# Patient Record
Sex: Male | Born: 1980 | Race: White | Hispanic: No
Health system: Midwestern US, Community
[De-identification: ages and names within clinical notes are randomized; demographics above are authoritative.]

## PROBLEM LIST (undated history)

## (undated) DIAGNOSIS — M2550 Pain in unspecified joint: Secondary | ICD-10-CM

## (undated) DIAGNOSIS — K219 Gastro-esophageal reflux disease without esophagitis: Secondary | ICD-10-CM

## (undated) DIAGNOSIS — N200 Calculus of kidney: Secondary | ICD-10-CM

## (undated) DIAGNOSIS — J189 Pneumonia, unspecified organism: Secondary | ICD-10-CM

## (undated) DIAGNOSIS — T7840XA Allergy, unspecified, initial encounter: Secondary | ICD-10-CM

## (undated) DIAGNOSIS — F32A Depression, unspecified: Secondary | ICD-10-CM

## (undated) DIAGNOSIS — M069 Rheumatoid arthritis, unspecified: Secondary | ICD-10-CM

## (undated) DIAGNOSIS — K859 Acute pancreatitis without necrosis or infection, unspecified: Secondary | ICD-10-CM

## (undated) DIAGNOSIS — F419 Anxiety disorder, unspecified: Secondary | ICD-10-CM

## (undated) HISTORY — DX: Allergy, unspecified, initial encounter: T78.40XA

## (undated) HISTORY — PX: TONSILLECTOMY: SUR1361

## (undated) HISTORY — DX: Depression, unspecified: F32.A

## (undated) HISTORY — DX: Anxiety disorder, unspecified: F41.9

## (undated) HISTORY — PX: APPENDECTOMY: SHX54

## (undated) HISTORY — DX: Rheumatoid arthritis, unspecified: M06.9

## (undated) HISTORY — DX: Gastro-esophageal reflux disease without esophagitis: K21.9

## (undated) HISTORY — DX: Pain in unspecified joint: M25.50

---

## 2004-07-04 ENCOUNTER — Emergency Department: Payer: Self-pay | Admitting: Emergency Medicine

## 2005-02-28 ENCOUNTER — Emergency Department: Payer: Self-pay | Admitting: Emergency Medicine

## 2005-12-27 ENCOUNTER — Emergency Department: Payer: Self-pay | Admitting: General Practice

## 2006-05-22 ENCOUNTER — Emergency Department: Payer: Self-pay

## 2006-07-09 ENCOUNTER — Emergency Department: Payer: Self-pay | Admitting: Emergency Medicine

## 2006-10-11 ENCOUNTER — Emergency Department: Payer: Self-pay | Admitting: Emergency Medicine

## 2007-02-18 ENCOUNTER — Emergency Department: Payer: Self-pay | Admitting: Emergency Medicine

## 2007-09-20 ENCOUNTER — Emergency Department: Payer: Self-pay | Admitting: Emergency Medicine

## 2007-09-26 ENCOUNTER — Emergency Department: Payer: Self-pay | Admitting: Internal Medicine

## 2007-11-03 ENCOUNTER — Ambulatory Visit: Payer: Self-pay | Admitting: General Surgery

## 2007-11-12 ENCOUNTER — Emergency Department: Payer: Self-pay | Admitting: Emergency Medicine

## 2008-02-29 ENCOUNTER — Emergency Department: Payer: Self-pay | Admitting: Emergency Medicine

## 2008-03-15 ENCOUNTER — Emergency Department: Payer: Self-pay | Admitting: Emergency Medicine

## 2008-03-25 ENCOUNTER — Emergency Department: Payer: Self-pay | Admitting: Emergency Medicine

## 2008-03-26 ENCOUNTER — Emergency Department: Payer: Self-pay | Admitting: Emergency Medicine

## 2008-06-15 ENCOUNTER — Observation Stay: Payer: Self-pay | Admitting: General Surgery

## 2008-10-17 ENCOUNTER — Emergency Department: Payer: Self-pay | Admitting: Emergency Medicine

## 2008-10-20 ENCOUNTER — Emergency Department: Payer: Self-pay | Admitting: Unknown Physician Specialty

## 2009-04-28 ENCOUNTER — Emergency Department: Payer: Self-pay | Admitting: Internal Medicine

## 2009-05-03 ENCOUNTER — Emergency Department: Payer: Self-pay | Admitting: Unknown Physician Specialty

## 2011-08-03 ENCOUNTER — Emergency Department: Payer: Self-pay | Admitting: *Deleted

## 2011-08-03 LAB — BASIC METABOLIC PANEL
Calcium, Total: 9.9 mg/dL (ref 8.5–10.1)
Co2: 26 mmol/L (ref 21–32)
Creatinine: 1.04 mg/dL (ref 0.60–1.30)
EGFR (African American): >60
EGFR (Non-African Amer.): >60
Glucose: 102 mg/dL — ABNORMAL HIGH (ref 65–99)
Potassium: 4.3 mmol/L (ref 3.5–5.1)

## 2011-08-03 LAB — CBC
HCT: 50.1 % (ref 40.0–52.0)
HGB: 17.4 g/dL (ref 13.0–18.0)
MCH: 32 pg (ref 26.0–34.0)
MCHC: 34.7 g/dL (ref 32.0–36.0)
Platelet: 163 10*3/uL (ref 150–440)
RBC: 5.44 10*6/uL (ref 4.40–5.90)
RDW: 12.8 % (ref 11.5–14.5)
WBC: 7.8 10*3/uL (ref 3.8–10.6)

## 2011-08-03 LAB — TROPONIN I: Troponin-I: <0.02 ng/mL

## 2011-08-03 LAB — CK TOTAL AND CKMB (NOT AT ARMC): CK-MB: 1.2 ng/mL (ref 0.5–3.6)

## 2011-08-04 LAB — LIPASE, BLOOD: Lipase: 97 U/L (ref 73–393)

## 2012-07-29 ENCOUNTER — Emergency Department: Payer: Self-pay | Admitting: Emergency Medicine

## 2012-07-29 LAB — URINALYSIS, COMPLETE
Bilirubin,UR: NEGATIVE
Glucose,UR: NEGATIVE mg/dL (ref 0–75)
Leukocyte Esterase: NEGATIVE
Nitrite: NEGATIVE
Ph: 6 (ref 4.5–8.0)
Protein: 100
RBC,UR: 1184 /HPF (ref 0–5)
Specific Gravity: 1.027 (ref 1.003–1.030)
Squamous Epithelial: 4

## 2012-07-29 LAB — BASIC METABOLIC PANEL
Anion Gap: 5 — ABNORMAL LOW (ref 7–16)
Calcium, Total: 8.7 mg/dL (ref 8.5–10.1)
Creatinine: 0.97 mg/dL (ref 0.60–1.30)
EGFR (Non-African Amer.): >60
Osmolality: 280 (ref 275–301)
Sodium: 140 mmol/L (ref 136–145)

## 2012-07-29 LAB — CBC WITH DIFFERENTIAL/PLATELET
Basophil #: 0.1 10*3/uL (ref 0.0–0.1)
Eosinophil %: 10.5 %
HCT: 45.6 % (ref 40.0–52.0)
HGB: 16.1 g/dL (ref 13.0–18.0)
MCH: 31.7 pg (ref 26.0–34.0)
MCHC: 35.4 g/dL (ref 32.0–36.0)
Monocyte #: 0.6 x10 3/mm (ref 0.2–1.0)
Neutrophil %: 64.9 %
RDW: 12.6 % (ref 11.5–14.5)

## 2012-09-07 ENCOUNTER — Emergency Department: Payer: Self-pay | Admitting: Emergency Medicine

## 2012-09-08 LAB — CBC
HCT: 47.8 % (ref 40.0–52.0)
HGB: 16.5 g/dL (ref 13.0–18.0)
MCHC: 34.5 g/dL (ref 32.0–36.0)
Platelet: 169 10*3/uL (ref 150–440)
RBC: 5.27 10*6/uL (ref 4.40–5.90)

## 2012-09-08 LAB — URINALYSIS, COMPLETE
Bilirubin,UR: NEGATIVE
Glucose,UR: 50 mg/dL (ref 0–75)
Nitrite: NEGATIVE
Ph: 5 (ref 4.5–8.0)
RBC,UR: 2302 /HPF (ref 0–5)
Specific Gravity: 1.021 (ref 1.003–1.030)
Squamous Epithelial: 1
WBC UR: 39 /HPF (ref 0–5)

## 2012-09-08 LAB — BASIC METABOLIC PANEL
Anion Gap: 6 — ABNORMAL LOW (ref 7–16)
Chloride: 111 mmol/L — ABNORMAL HIGH (ref 98–107)
EGFR (African American): >60
Glucose: 114 mg/dL — ABNORMAL HIGH (ref 65–99)
Osmolality: 276 (ref 275–301)
Sodium: 138 mmol/L (ref 136–145)

## 2013-02-02 ENCOUNTER — Emergency Department: Payer: Self-pay | Admitting: Emergency Medicine

## 2013-02-02 LAB — COMPREHENSIVE METABOLIC PANEL
Anion Gap: 10 (ref 7–16)
BUN: 16 mg/dL (ref 7–18)
Bilirubin,Total: 0.5 mg/dL (ref 0.2–1.0)
Co2: 18 mmol/L — ABNORMAL LOW (ref 21–32)
Creatinine: 1.19 mg/dL (ref 0.60–1.30)
EGFR (African American): >60
EGFR (Non-African Amer.): >60
Glucose: 128 mg/dL — ABNORMAL HIGH (ref 65–99)
Osmolality: 280 (ref 275–301)
Potassium: 3.8 mmol/L (ref 3.5–5.1)
SGPT (ALT): 47 U/L (ref 12–78)
Sodium: 139 mmol/L (ref 136–145)
Total Protein: 7.6 g/dL (ref 6.4–8.2)

## 2013-02-02 LAB — URINALYSIS, COMPLETE
Bilirubin,UR: NEGATIVE
Blood: NEGATIVE
Glucose,UR: NEGATIVE mg/dL (ref 0–75)
Protein: 30
Specific Gravity: 1.035 (ref 1.003–1.030)
Squamous Epithelial: 2
WBC UR: 8 /HPF (ref 0–5)

## 2013-02-02 LAB — CBC WITH DIFFERENTIAL/PLATELET
Basophil #: 0 10*3/uL (ref 0.0–0.1)
Eosinophil #: 0.8 10*3/uL — ABNORMAL HIGH (ref 0.0–0.7)
Eosinophil %: 7.9 %
HCT: 48.1 % (ref 40.0–52.0)
Lymphocyte %: 20 %
MCH: 31.6 pg (ref 26.0–34.0)
Monocyte %: 7.6 %
Neutrophil #: 6.6 10*3/uL — ABNORMAL HIGH (ref 1.4–6.5)
Neutrophil %: 64 %
Platelet: 200 10*3/uL (ref 150–440)
RDW: 13.1 % (ref 11.5–14.5)
WBC: 10.2 10*3/uL (ref 3.8–10.6)

## 2013-02-02 LAB — LIPASE, BLOOD: Lipase: 112 U/L (ref 73–393)

## 2013-09-16 ENCOUNTER — Emergency Department: Payer: Self-pay | Admitting: Emergency Medicine

## 2013-09-16 LAB — URINALYSIS, COMPLETE
BACTERIA: NONE SEEN
Bilirubin,UR: NEGATIVE
Glucose,UR: NEGATIVE mg/dL (ref 0–75)
Ketone: NEGATIVE
LEUKOCYTE ESTERASE: NEGATIVE
Nitrite: NEGATIVE
PH: 5 (ref 4.5–8.0)
Protein: NEGATIVE
SQUAMOUS EPITHELIAL: NONE SEEN
Specific Gravity: 1.021 (ref 1.003–1.030)

## 2013-09-16 LAB — COMPREHENSIVE METABOLIC PANEL
ANION GAP: 8 (ref 7–16)
Albumin: 3.8 g/dL (ref 3.4–5.0)
Alkaline Phosphatase: 85 U/L
BUN: 14 mg/dL (ref 7–18)
Bilirubin,Total: 0.5 mg/dL (ref 0.2–1.0)
CALCIUM: 8.5 mg/dL (ref 8.5–10.1)
CHLORIDE: 107 mmol/L (ref 98–107)
CO2: 22 mmol/L (ref 21–32)
Creatinine: 1.13 mg/dL (ref 0.60–1.30)
EGFR (Non-African Amer.): >60
Glucose: 105 mg/dL — ABNORMAL HIGH (ref 65–99)
Osmolality: 275 (ref 275–301)
Potassium: 3.9 mmol/L (ref 3.5–5.1)
SGOT(AST): 34 U/L (ref 15–37)
SGPT (ALT): 61 U/L
SODIUM: 137 mmol/L (ref 136–145)
TOTAL PROTEIN: 8 g/dL (ref 6.4–8.2)

## 2013-09-16 LAB — ACETAMINOPHEN LEVEL: ACETAMINOPHEN: 4 ug/mL — AB

## 2013-09-16 LAB — CBC
HCT: 50.2 % (ref 40.0–52.0)
HGB: 17.2 g/dL (ref 13.0–18.0)
MCH: 31.8 pg (ref 26.0–34.0)
MCHC: 34.2 g/dL (ref 32.0–36.0)
MCV: 93 fL (ref 80–100)
Platelet: 157 10*3/uL (ref 150–440)
RBC: 5.41 10*6/uL (ref 4.40–5.90)
RDW: 12.6 % (ref 11.5–14.5)
WBC: 8.8 10*3/uL (ref 3.8–10.6)

## 2013-09-16 LAB — DRUG SCREEN, URINE
Amphetamines, Ur Screen: NEGATIVE (ref ?–1000)
Barbiturates, Ur Screen: NEGATIVE (ref ?–200)
Benzodiazepine, Ur Scrn: NEGATIVE (ref ?–200)
Cannabinoid 50 Ng, Ur ~~LOC~~: POSITIVE (ref ?–50)
Cocaine Metabolite,Ur ~~LOC~~: NEGATIVE (ref ?–300)
MDMA (Ecstasy)Ur Screen: NEGATIVE (ref ?–500)
Methadone, Ur Screen: NEGATIVE (ref ?–300)
Opiate, Ur Screen: NEGATIVE (ref ?–300)
Phencyclidine (PCP) Ur S: NEGATIVE (ref ?–25)
TRICYCLIC, UR SCREEN: NEGATIVE (ref ?–1000)

## 2013-09-16 LAB — ETHANOL: Ethanol %: <0.003 % (ref 0.000–0.080)

## 2013-09-16 LAB — SALICYLATE LEVEL: SALICYLATES, SERUM: 3.1 mg/dL — AB

## 2014-02-22 DIAGNOSIS — K859 Acute pancreatitis without necrosis or infection, unspecified: Secondary | ICD-10-CM

## 2014-02-22 HISTORY — DX: Acute pancreatitis without necrosis or infection, unspecified: K85.90

## 2014-05-01 DIAGNOSIS — Z6841 Body Mass Index (BMI) 40.0 and over, adult: Secondary | ICD-10-CM | POA: Insufficient documentation

## 2014-05-03 DIAGNOSIS — R161 Splenomegaly, not elsewhere classified: Secondary | ICD-10-CM | POA: Insufficient documentation

## 2014-05-03 DIAGNOSIS — K760 Fatty (change of) liver, not elsewhere classified: Secondary | ICD-10-CM | POA: Insufficient documentation

## 2014-05-03 DIAGNOSIS — Z791 Long term (current) use of non-steroidal anti-inflammatories (NSAID): Secondary | ICD-10-CM | POA: Insufficient documentation

## 2014-06-15 NOTE — Consult Note (Signed)
PATIENT NAME:  Bradley Barr, Jaclyn F MR#:  [id number] DATE OF BIRTH:  February 25, 1980  DATE OF CONSULTATION:  09/16/2013  REFERRING PHYSICIAN:   CONSULTING PHYSICIAN:  Henessy Rohrer K. Guss Bundehalla, MD  PLACE OF DICTATION: [facility]   SEX: Male.  RACE: White.  SUBJECTIVE: The patient was seen in consultation in [facility]. The patient is a 34 year old white male, not employed and last worked in the early part of 2015 and he lost his job. Married for 7 years and lives with his wife. 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 The patient was upset because he was stressed out with lots of stressors which include 2 of his cars are not working at the same time and in addition, not having a job and having financial stressors, and he went out and he could not get a ride back and when he called his wife, she did not reply and when he found a ride and came home, she was not very friendly and this has upset him. He expected her to hug him and tell him that he would be okay, as his job is going to begin tomorrow, 09/17/2013. Instead, she was rude to him. This made him upset and so he talked about hurting himself because he was frustrated and upset.   PAST PSYCHIATRIC HISTORY: History of inpatient holds on psychiatry more than 10 years ago at [facility] for 4 days when he overdosed on Klonopin. No history of suicide attempts.   ALCOHOL AND DRUGS: Admits that he smokes THC occasionally but not on a regular basis. Smokes nicotine cigarettes at the rate of one-half pack a day for many years.   MENTAL STATUS EXAMINATION: The patient is alert and oriented to place, person and time. Fully aware of situation that [facility]. Affect is bright and cheerful. Denies feeling depressed. Denies feeling hopeless or helpless. Does admit being frustrated about both  of his cars not working at the same time and being unemployed for quite some time and having financial stressors related to the same, which have led to marital conflicts. No psychosis. Denies auditory or visual hallucinations. Memory is intact. Cognition intact. General fund of information fair. Contracts for safety. Insight and judgment fair.  IMPRESSION:  1. Impulse control disorder. 2. Adjustment disorder with frustration and depression secondary to losing job and being unemployed since January 2015. 3. Recommend D/C IVC and discharge patient home to go back to his family and he is eager to join his job on 09/17/2013 and does admit that he has done something "stupid" and he did not mean it. The patient will get counseling on an outpatient basis and he will get marital counseling so that he and his wife will have a better understanding of each other.       ____________________________ Jannet MantisSurya K. Guss Bundehalla, MD skc:lm D: 09/16/2013 17:52:46 ET T: 09/16/2013 20:36:18 ET JOB#: [id number]  cc: Monika SalkSurya K. Guss Bundehalla, MD, <Dictator> Beau FannySURYA K Jed Kutch MD ELECTRONICALLY SIGNED 09/17/2013 7:55

## 2014-12-12 ENCOUNTER — Encounter: Payer: Self-pay | Admitting: Emergency Medicine

## 2014-12-12 ENCOUNTER — Emergency Department: Payer: Self-pay

## 2014-12-12 ENCOUNTER — Emergency Department
Admission: EM | Admit: 2014-12-12 | Discharge: 2014-12-12 | Disposition: A | Discharge status: Home / Self Care | Payer: Self-pay | Attending: Emergency Medicine | Admitting: Emergency Medicine

## 2014-12-12 DIAGNOSIS — Y92410 Unspecified street and highway as the place of occurrence of the external cause: Secondary | ICD-10-CM | POA: Insufficient documentation

## 2014-12-12 DIAGNOSIS — S8001XA Contusion of right knee, initial encounter: Secondary | ICD-10-CM | POA: Insufficient documentation

## 2014-12-12 DIAGNOSIS — Y9389 Activity, other specified: Secondary | ICD-10-CM | POA: Insufficient documentation

## 2014-12-12 DIAGNOSIS — Z720 Tobacco use: Secondary | ICD-10-CM | POA: Insufficient documentation

## 2014-12-12 DIAGNOSIS — Y998 Other external cause status: Secondary | ICD-10-CM | POA: Insufficient documentation

## 2014-12-12 DIAGNOSIS — S20211A Contusion of right front wall of thorax, initial encounter: Secondary | ICD-10-CM | POA: Insufficient documentation

## 2014-12-12 HISTORY — DX: Calculus of kidney: N20.0

## 2014-12-12 HISTORY — DX: Acute pancreatitis without necrosis or infection, unspecified: K85.90

## 2014-12-12 MED ORDER — HYDROCODONE-ACETAMINOPHEN 5-325 MG PO TABS: 1.0000 | ORAL_TABLET | ORAL | Status: DC | PRN

## 2014-12-12 MED ORDER — IBUPROFEN 800 MG PO TABS: 800.0000 mg | ORAL_TABLET | Freq: Three times a day (TID) | ORAL | Status: DC

## 2014-12-12 NOTE — Discharge Instructions (Signed)
Chest Contusion A contusion is a deep bruise. Bruises happen when an injury causes bleeding under the skin. Signs of bruising include pain, puffiness (swelling), and discolored skin. The bruise may turn blue, purple, or yellow.  HOME CARE  Put ice on the injured area.  Put ice in a plastic bag.  Place a towel between the skin and the bag.  Leave the ice on for 15-20 minutes at a time, 03-04 times a day for the first 48 hours.  Only take medicine as told by your doctor.  Rest.  Take deep breaths (deep-breathing exercises) as told by your doctor.  Stop smoking if you smoke.  Do not lift objects over 5 pounds (2.3 kilograms) for 3 days or longer if told by your doctor. GET HELP RIGHT AWAY IF:   You have more bruising or puffiness.  You have pain that gets worse.  You have trouble breathing.  You are dizzy, weak, or pass out (faint).  You have blood in your pee (urine) or poop (stool).  You cough up or throw up (vomit) blood.  Your puffiness or pain is not helped with medicines. MAKE SURE YOU:   Understand these instructions.  Will watch your condition.  Will get help right away if you are not doing well or get worse.   This information is not intended to replace advice given to you by your health care provider. Make sure you discuss any questions you have with your health care provider.   Document Released: 07/28/2007 Document Revised: 11/03/2011 Document Reviewed: 08/02/2011 Elsevier Interactive Patient Education [employer] Elsevier Inc.    Follow-up with your doctor or [facility] if any continued problems with your chest and ribs. Follow-up with Dr. Joice LoftsPoggi if any continued problems with her knee. Ibuprofen as needed for inflammation and soreness. Norco as needed for severe pain. Ice and elevate knee as needed.
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
## 2014-12-18 ENCOUNTER — Emergency Department
Admission: EM | Admit: 2014-12-18 | Discharge: 2014-12-18 | Disposition: A | Discharge status: Home / Self Care | Payer: No Typology Code available for payment source | Attending: Emergency Medicine | Admitting: Emergency Medicine

## 2014-12-18 DIAGNOSIS — F32A Depression, unspecified: Secondary | ICD-10-CM

## 2014-12-18 DIAGNOSIS — Z720 Tobacco use: Secondary | ICD-10-CM | POA: Insufficient documentation

## 2014-12-18 DIAGNOSIS — F410 Panic disorder [episodic paroxysmal anxiety] without agoraphobia: Secondary | ICD-10-CM

## 2014-12-18 DIAGNOSIS — Z79899 Other long term (current) drug therapy: Secondary | ICD-10-CM | POA: Insufficient documentation

## 2014-12-18 DIAGNOSIS — F331 Major depressive disorder, recurrent, moderate: Secondary | ICD-10-CM

## 2014-12-18 DIAGNOSIS — F329 Major depressive disorder, single episode, unspecified: Secondary | ICD-10-CM | POA: Insufficient documentation

## 2014-12-18 LAB — COMPREHENSIVE METABOLIC PANEL
ALBUMIN: 4.8 g/dL (ref 3.5–5.0)
ALT: 40 U/L (ref 17–63)
ANION GAP: 7 (ref 5–15)
AST: 25 U/L (ref 15–41)
Alkaline Phosphatase: 72 U/L (ref 38–126)
BUN: 10 mg/dL (ref 6–20)
CHLORIDE: 103 mmol/L (ref 101–111)
CO2: 28 mmol/L (ref 22–32)
Calcium: 9.4 mg/dL (ref 8.9–10.3)
Creatinine, Ser: 1.26 mg/dL — ABNORMAL HIGH (ref 0.61–1.24)
GFR calc non Af Amer: >60 mL/min (ref >60)
GLUCOSE: 109 mg/dL — AB (ref 65–99)
Potassium: 4.1 mmol/L (ref 3.5–5.1)
SODIUM: 138 mmol/L (ref 135–145)
Total Bilirubin: 1.1 mg/dL (ref 0.3–1.2)
Total Protein: 8.2 g/dL — ABNORMAL HIGH (ref 6.5–8.1)

## 2014-12-18 LAB — CBC
HCT: 52.8 % — ABNORMAL HIGH (ref 40.0–52.0)
HEMOGLOBIN: 17.9 g/dL (ref 13.0–18.0)
MCH: 32.2 pg (ref 26.0–34.0)
MCHC: 33.9 g/dL (ref 32.0–36.0)
MCV: 94.9 fL (ref 80.0–100.0)
Platelets: 192 10*3/uL (ref 150–440)
RBC: 5.56 MIL/uL (ref 4.40–5.90)
RDW: 13 % (ref 11.5–14.5)
WBC: 9.1 10*3/uL (ref 3.8–10.6)

## 2014-12-18 LAB — SALICYLATE LEVEL

## 2014-12-18 LAB — ETHANOL: Alcohol, Ethyl (B): <5 mg/dL (ref <5)

## 2014-12-18 LAB — ACETAMINOPHEN LEVEL

## 2014-12-18 MED ORDER — CITALOPRAM HYDROBROMIDE 20 MG PO TABS: 20.0000 mg | ORAL_TABLET | Freq: Every day | ORAL | Status: DC

## 2014-12-18 NOTE — ED Notes (Signed)
Mr. Bradley Barr was dressed out into the proper hospital purple scrubs and taken to rm. 20 for further care. Amy Bruna Pottereague was the nurse.

## 2014-12-18 NOTE — ED Notes (Signed)
1/1 bags of belongings returned to him and he verbal;ized that he received back all belongings that he came here with   Discharge and follow up instructions reviewed with him and he verbalized agreement and understanding   

## 2014-12-18 NOTE — ED Notes (Signed)
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
## 2014-12-18 NOTE — ED Notes (Signed)
Lunch provided along with an extra drink   Appropriate to stimulation  No verbalized needs or concerns at this time  NAD assessed  Continue to monitor 

## 2014-12-18 NOTE — [facility] Counselor (Signed)
Per request of Psych  MD (Dr. Clapacs), writer provided the pt. with information and instructions on how to access Outpatient Mental Health & Substance Abuse Treatment ([facility]). 

## 2014-12-18 NOTE — ED Notes (Signed)
BEHAVIORAL HEALTH ROUNDING Patient sleeping: No. Patient alert and oriented: yes Behavior appropriate: Yes.  ; If no, describe:  Nutrition and fluids offered: yes Toileting and hygiene offered: Yes  Sitter present: q15 minute observations and security camera monitoring Law enforcement present: Yes  ODS  

## 2014-12-18 NOTE — ED Notes (Signed)
Patient comes in tearful and verbalized that "I got a lot of shit goin on".  Patient stated "I can't stop crying".
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
## 2015-02-05 ENCOUNTER — Emergency Department (HOSPITAL_COMMUNITY): Payer: Worker's Compensation

## 2015-02-05 ENCOUNTER — Encounter (HOSPITAL_COMMUNITY): Payer: Self-pay | Admitting: Emergency Medicine

## 2015-02-05 ENCOUNTER — Emergency Department (HOSPITAL_COMMUNITY)
Admission: EM | Admit: 2015-02-05 | Discharge: 2015-02-05 | Disposition: A | Discharge status: Home / Self Care | Payer: Worker's Compensation | Attending: Emergency Medicine | Admitting: Emergency Medicine

## 2015-02-05 DIAGNOSIS — Z8719 Personal history of other diseases of the digestive system: Secondary | ICD-10-CM | POA: Diagnosis not present

## 2015-02-05 DIAGNOSIS — Z79899 Other long term (current) drug therapy: Secondary | ICD-10-CM | POA: Diagnosis not present

## 2015-02-05 DIAGNOSIS — Y9289 Other specified places as the place of occurrence of the external cause: Secondary | ICD-10-CM | POA: Diagnosis not present

## 2015-02-05 DIAGNOSIS — Y9389 Activity, other specified: Secondary | ICD-10-CM | POA: Diagnosis not present

## 2015-02-05 DIAGNOSIS — W010XXA Fall on same level from slipping, tripping and stumbling without subsequent striking against object, initial encounter: Secondary | ICD-10-CM | POA: Insufficient documentation

## 2015-02-05 DIAGNOSIS — M25561 Pain in right knee: Secondary | ICD-10-CM

## 2015-02-05 DIAGNOSIS — S8991XA Unspecified injury of right lower leg, initial encounter: Secondary | ICD-10-CM | POA: Diagnosis not present

## 2015-02-05 DIAGNOSIS — Z87442 Personal history of urinary calculi: Secondary | ICD-10-CM | POA: Diagnosis not present

## 2015-02-05 DIAGNOSIS — Y998 Other external cause status: Secondary | ICD-10-CM | POA: Diagnosis not present

## 2015-02-05 DIAGNOSIS — F17210 Nicotine dependence, cigarettes, uncomplicated: Secondary | ICD-10-CM | POA: Diagnosis not present

## 2015-02-05 MED ORDER — NAPROXEN 250 MG PO TABS
500.0000 mg | ORAL_TABLET | Freq: Once | ORAL | Status: AC
  Administered 2015-02-05: 500 mg via ORAL
  Filled 2015-02-05: qty 2

## 2015-02-05 NOTE — ED Notes (Signed)
Pt verbally abusive at time of discharge, "that's a shitty ass doctor, I'm supposed to go home with just tylenol? I can't walk. That's why I hate this hospital. I always have a problem here. This is a shitty hospital." Upset about not getting narcotics. Escorted to lobby with crutches via wheelchair. No belongings left in room.

## 2015-02-05 NOTE — ED Provider Notes (Signed)
CSN: [id number]     Arrival date & time 02/05/15  1909 History   First MD Initiated Contact with Patient 02/05/15 1914     Chief Complaint  Patient presents with  . Knee Pain    Patient is a 34 y.o. male presenting with knee pain. The history is provided by the patient.  Knee Pain Location:  Knee Time since incident:  30 minutes Injury: yes   Mechanism of injury: fall   Fall:    Fall occurred:  Tripped   Height of fall:  Standing Knee location:  R knee Pain details:    Quality:  Pressure and throbbing   Onset quality:  Sudden   Timing:  Constant Chronicity:  New Associated symptoms: no back pain, no fever and no neck pain     Past Medical History  Diagnosis Date  . Pancreatitis   . Kidney stones    History reviewed. No pertinent past surgical history. No family history on file. Social History  Substance Use Topics  . Smoking status: Current Every Day Smoker -- 0.50 packs/day    Types: Cigarettes  . Smokeless tobacco: None  . Alcohol Use: Yes     Comment: occasional    Review of Systems  Constitutional: Negative for fever and chills.  HENT: Negative for rhinorrhea and sore throat.   Eyes: Negative for visual disturbance.  Respiratory: Negative for cough and shortness of breath.   Cardiovascular: Negative for chest pain.  Gastrointestinal: Negative for nausea, vomiting, abdominal pain, diarrhea and constipation.  Genitourinary: Negative for dysuria and hematuria.  Musculoskeletal: Negative for back pain and neck pain.       Knee pain and swelling  Skin: Negative for rash.  Neurological: Negative for syncope and headaches.  Psychiatric/Behavioral: Negative for confusion.  All other systems reviewed and are negative.  Allergies  Review of patient's allergies indicates no known allergies.  Home Medications   Prior to Admission medications   Medication Sig Start Date End Date Taking? Authorizing Provider  citalopram (CELEXA) 20 MG tablet Take 1 tablet (20 mg  total) by mouth daily. 12/18/14  Yes Audery AmelJohn T Clapacs, MD   BP 116/72 mmHg  Pulse 98  Temp(Src) 98.2 F (36.8 C) (Oral)  Resp 16  Ht 5\' 9"  (1.753 m)  Wt 145.151 kg  BMI 47.23 kg/m2  SpO2 100% Physical Exam  Constitutional: He is oriented to person, place, and time. He appears well-developed and well-nourished. No distress.  HENT:  Head: Normocephalic and atraumatic.  Mouth/Throat: Oropharynx is clear and moist.  Eyes: EOM are normal.  Neck: Neck supple. No JVD present.  Cardiovascular: Normal rate, regular rhythm, normal heart sounds and intact distal pulses.   Pulmonary/Chest: Effort normal and breath sounds normal.  Abdominal: Soft. He exhibits no distension. There is no tenderness.  Musculoskeletal: He exhibits no edema.       Right knee: He exhibits decreased range of motion and swelling. He exhibits no deformity.  Right knee extensor mechanism intact. 2+ DP and PT pulses. Ankle range of motion intact. No obvious bony deformity. No patellar dislocation.  Neurological: He is alert and oriented to person, place, and time. No cranial nerve deficit.  Skin: Skin is warm and dry.  Psychiatric: His behavior is normal.    ED Course  Procedures  NONE   Labs Review Labs Reviewed - No data to display  Imaging Review Dg Knee 2 Views Right  02/05/2015  CLINICAL DATA:  Larey SeatFell at work tonight.  Right knee pain. EXAM:  RIGHT KNEE - 1-2 VIEW COMPARISON:  02/28/2005 FINDINGS: Moderate tricompartmental degenerative changes for age, progressive since prior examination. There is mild joint space narrowing and osteophytic spurring. Could not exclude a small loose ossified body in the joint. No acute fracture or obvious joint effusion. IMPRESSION: Age advanced tricompartmental degenerative changes but no acute fracture. Electronically Signed   By: Rudie Meyer M.D.   On: 02/05/2015 19:50   I have personally reviewed and evaluated these images and lab results as part of my medical  decision-making.  MDM   Final diagnoses:  Right knee pain    Patient is a 34 year old male who presents with acute onset right knee pain after a fall. Slipped on wet floor. Things he hyperextended his knee. Pain with ROM but no instability. Extensor mechanism intact. He did not land directly on his patella. Doubt patellar fracture. Distal pulses and neurologic function intact. Suspect ligamentous or meniscal injury. Will obtain x-ray and provide pain control. Discussed outpatient MRI and ortho followup. Anticipate discharge.   Imaging negative. I reiterated his discharge instructions with him. She requested "stronger pain medication." I discussed RICE therapy with him as well as management with anti-inflammatory medication. Patient voiced understanding and is agreeable with the follow-up plan. Knee immobilizer and crutches provided. Patient stable for discharge.  Discussed with Dr. Clayborne Dana.  Maris Berger, MD 02/06/15 0001  Marily Memos, MD 02/06/15 [phone]

## 2015-02-05 NOTE — ED Notes (Signed)
Patient transported to X-ray 

## 2015-02-05 NOTE — ED Notes (Signed)
Pt arrives via EMS from work, slipped on motor oil, fell on R hip and R knee and feels pain in those areas. Received 250 mcg fentanyl and 4mg  zofran with EMS. Unable to bear weight on R leg. VSS.

## 2015-02-05 NOTE — ED Provider Notes (Signed)
I saw and evaluated the patient, reviewed the resident's note and I agree with the findings and plan.   34 year old male states that he started to slip on oil felt a pop in his right knee and fell to the ground landing on his knee. Pain and swelling since that time. Exam with tenderness in that area. Suspect possible ligamentous injury. X-rays negative. We'll discuss follow-up with orthopedics.  Marily MemosJason Shuntia Exton, MD 02/05/15 [phone]

## 2015-02-07 ENCOUNTER — Telehealth: Payer: Self-pay | Admitting: *Deleted

## 2015-02-07 NOTE — Telephone Encounter (Signed)
Pt and employer called verifying Rx and weight restrictions.  NCM reviewed chart to fing no Rx given along with no weight restrictions.  Information relayed to pt and employer.

## 2015-06-10 DIAGNOSIS — F17210 Nicotine dependence, cigarettes, uncomplicated: Secondary | ICD-10-CM | POA: Insufficient documentation

## 2015-06-10 DIAGNOSIS — J029 Acute pharyngitis, unspecified: Secondary | ICD-10-CM | POA: Insufficient documentation

## 2015-06-10 NOTE — ED Notes (Signed)
Pt in with co problems swallowing was seen at [facility] for the same and given prednisone. States it is painful to swallow but has been drinking fluid.  Some redness and irritation noted to throat with no swelling.

## 2015-06-11 ENCOUNTER — Emergency Department
Admission: EM | Admit: 2015-06-11 | Discharge: 2015-06-11 | Disposition: A | Discharge status: Home / Self Care | Payer: Self-pay | Attending: Emergency Medicine | Admitting: Emergency Medicine

## 2015-06-11 ENCOUNTER — Emergency Department: Payer: Self-pay

## 2015-06-11 DIAGNOSIS — J029 Acute pharyngitis, unspecified: Secondary | ICD-10-CM

## 2015-06-11 LAB — POCT RAPID STREP A: STREPTOCOCCUS, GROUP A SCREEN (DIRECT): NEGATIVE

## 2015-06-11 MED ORDER — NYSTATIN 100000 UNIT/ML MT SUSP
5.0000 mL | Freq: Four times a day (QID) | OROMUCOSAL | Status: DC
  Administered 2015-06-11: 500000 [IU] via ORAL
  Filled 2015-06-11 (×4): qty 5

## 2015-06-11 MED ORDER — NYSTATIN 100000 UNIT/ML MT SUSP: 5.0000 mL | Freq: Four times a day (QID) | OROMUCOSAL | Status: AC

## 2015-06-11 MED ORDER — PENICILLIN G BENZATHINE 1200000 UNIT/2ML IM SUSP
1.2000 10*6.[IU] | Freq: Once | INTRAMUSCULAR | Status: AC
  Administered 2015-06-11: 1.2 10*6.[IU] via INTRAMUSCULAR
  Filled 2015-06-11: qty 2

## 2015-06-11 NOTE — ED Provider Notes (Signed)
[facility] Emergency Department Provider Note  ____________________________________________  Time seen: 2:00 AM  I have reviewed the triage vital signs and the nursing notes.   HISTORY  Chief Complaint Dysphagia    HPI Bradley Barr is a 35 y.o. male presents with sore throat and difficulty swallowing and redness noted in his throat times approximately one week. Patient states that he was [facility] and diagnosed with pharyngitis and prescribed prednisone however symptoms have persists. Patient denies any fever afebrile on presentation with a temperature 97.8. Patient does admit to noticing white plaque on his tongue which she was able to scrape off".    Past Medical History  Diagnosis Date  . Pancreatitis   . Kidney stones     Patient Active Problem List   Diagnosis Date Noted  . Depression, major, recurrent, moderate (HCC) 12/18/2014  . Panic attack 12/18/2014    No past surgical history on file.  Current Outpatient Rx  Name  Route  Sig  Dispense  Refill  . citalopram (CELEXA) 20 MG tablet   Oral   Take 1 tablet (20 mg total) by mouth daily.   30 tablet   2   . nystatin (MYCOSTATIN) 100000 UNIT/ML suspension   Oral   Take 5 mLs (500,000 Units total) by mouth 4 (four) times daily.   60 mL   0     Allergies No known drug allergies No family history on file.  Social History Social History  Substance Use Topics  . Smoking status: Current Every Day Smoker -- 0.50 packs/day    Types: Cigarettes  . Smokeless tobacco: Not on file  . Alcohol Use: Yes     Comment: occasional    Review of Systems  Constitutional: Negative for fever. Eyes: Negative for visual changes. ENT: Positive for sore throat. Cardiovascular: Negative for chest pain. Respiratory: Negative for shortness of breath. Gastrointestinal: Negative for abdominal pain, vomiting and diarrhea. Genitourinary: Negative for dysuria. Musculoskeletal: Negative for  back pain. Skin: Negative for rash. Neurological: Negative for headaches, focal weakness or numbness.   10-point ROS otherwise negative.  ____________________________________________   PHYSICAL EXAM:  VITAL SIGNS: ED Triage Vitals  Enc Vitals Group     BP 06/10/15 2234 142/86 mmHg     Pulse Rate 06/10/15 2234 64     Resp 06/10/15 2234 18     Temp 06/10/15 2234 97.8 F (36.6 C)     Temp Source 06/10/15 2234 Oral     SpO2 06/10/15 2234 93 %     Weight --      Height 06/10/15 2234 5\' 9"  (1.753 m)     Head Cir --      Peak Flow --      Pain Score 06/10/15 2243 0     Pain Loc --      Pain Edu? --      Excl. in GC? --      Constitutional: Alert and oriented. Well appearing and in no distress. Eyes: Conjunctivae are normal. PERRL. Normal extraocular movements. ENT   Head: Normocephalic and atraumatic.   Nose: No congestion/rhinnorhea.   Mouth/Throat: Pharyngeal erythema with scant exudate noted by the right tonsil.   Neck: No stridor. Hematological/Lymphatic/Immunilogical: No cervical lymphadenopathy. Cardiovascular: Normal rate, regular rhythm. Normal and symmetric distal pulses are present in all extremities. No murmurs, rubs, or gallops. Respiratory: Normal respiratory effort without tachypnea nor retractions. Breath sounds are clear and equal bilaterally. No wheezes/rales/rhonchi. Gastrointestinal: Soft and nontender. No distention. There  is no CVA tenderness. Genitourinary: deferred Musculoskeletal: Nontender with normal range of motion in all extremities. No joint effusions.  No lower extremity tenderness nor edema. Neurologic:  Normal speech and language. No gross focal neurologic deficits are appreciated. Speech is normal.  Skin:  Skin is warm, dry and intact. No rash noted. Psychiatric: Mood and affect are normal. Speech and behavior are normal. Patient exhibits appropriate insight and judgment.  ____________________________________________    LABS  (pertinent positives/negatives)  Labs Reviewed  POCT RAPID STREP A        INITIAL IMPRESSION / ASSESSMENT AND PLAN / ED COURSE  Pertinent labs & imaging results that were available during my care of the patient were reviewed by me and considered in my medical decision making (see chart for details).  Given history of physical exam concern for possible esophagitis secondary to fungal etiology versus bacterial as well as superimposed strep throat. Patient received penicillin in the emergency department IM as well as nystatin swish and swallow  ____________________________________________   FINAL CLINICAL IMPRESSION(S) / ED DIAGNOSES  Final diagnoses:  Pharyngitis      Darci Current, MD 06/11/15 [phone]

## 2015-06-11 NOTE — ED Notes (Signed)
Pt discharged to home.  Family member driving.  Discharge instructions reviewed.  Verbalized understanding.  No questions or concerns at this time.  Teach back verified.  Pt in NAD.  No items left in ED.   

## 2015-06-11 NOTE — Discharge Instructions (Signed)

## 2016-10-27 ENCOUNTER — Emergency Department (HOSPITAL_COMMUNITY)
Admission: EM | Admit: 2016-10-27 | Discharge: 2016-10-27 | Disposition: A | Discharge status: Home / Self Care | Payer: Self-pay | Attending: Emergency Medicine | Admitting: Emergency Medicine

## 2016-10-27 ENCOUNTER — Encounter (HOSPITAL_COMMUNITY): Payer: Self-pay | Admitting: *Deleted

## 2016-10-27 DIAGNOSIS — S61441A Puncture wound with foreign body of right hand, initial encounter: Secondary | ICD-10-CM | POA: Insufficient documentation

## 2016-10-27 DIAGNOSIS — Z23 Encounter for immunization: Secondary | ICD-10-CM | POA: Insufficient documentation

## 2016-10-27 DIAGNOSIS — W458XXA Other foreign body or object entering through skin, initial encounter: Secondary | ICD-10-CM | POA: Insufficient documentation

## 2016-10-27 DIAGNOSIS — F17210 Nicotine dependence, cigarettes, uncomplicated: Secondary | ICD-10-CM | POA: Insufficient documentation

## 2016-10-27 DIAGNOSIS — Y9389 Activity, other specified: Secondary | ICD-10-CM | POA: Insufficient documentation

## 2016-10-27 DIAGNOSIS — Z79899 Other long term (current) drug therapy: Secondary | ICD-10-CM | POA: Insufficient documentation

## 2016-10-27 DIAGNOSIS — Y999 Unspecified external cause status: Secondary | ICD-10-CM | POA: Insufficient documentation

## 2016-10-27 DIAGNOSIS — Y929 Unspecified place or not applicable: Secondary | ICD-10-CM | POA: Insufficient documentation

## 2016-10-27 DIAGNOSIS — S6991XA Unspecified injury of right wrist, hand and finger(s), initial encounter: Secondary | ICD-10-CM

## 2016-10-27 MED ORDER — TETANUS-DIPHTH-ACELL PERTUSSIS 5-2.5-18.5 LF-MCG/0.5 IM SUSP
0.5000 mL | Freq: Once | INTRAMUSCULAR | Status: AC
  Administered 2016-10-27: 0.5 mL via INTRAMUSCULAR
  Filled 2016-10-27: qty 0.5

## 2016-10-27 MED ORDER — LIDOCAINE HCL 2 % IJ SOLN
20.0000 mL | Freq: Once | INTRAMUSCULAR | Status: DC
  Filled 2016-10-27: qty 20

## 2016-10-27 MED ORDER — CEPHALEXIN 500 MG PO CAPS
500.0000 mg | ORAL_CAPSULE | Freq: Four times a day (QID) | ORAL | 0 refills | Status: DC
Start: 2016-10-27 — End: 2020-05-31

## 2016-10-27 NOTE — ED Provider Notes (Signed)
[facility]-EMERGENCY DEPT Provider Note   CSN: [id number] Arrival date & time: 10/27/16  0908     History   Chief Complaint Chief Complaint  Patient presents with  . Finger Injury    HPI Bradley Barr is a 36 y.o. male who presents with a fishhook injury. The incident happened about 1 hour ago when he was fishing and the barb when through his right thumb. He was unable to remove it himself. He reports mild pain to the area. No numbness or weakness. He is not up to date on tetanus.   HPI  Past Medical History:  Diagnosis Date  . Kidney stones   . Pancreatitis     Patient Active Problem List   Diagnosis Date Noted  . Depression, major, recurrent, moderate (HCC) 12/18/2014  . Panic attack 12/18/2014    History reviewed. No pertinent surgical history.     Home Medications    Prior to Admission medications   Medication Sig Start Date End Date Taking? Authorizing Provider  citalopram (CELEXA) 20 MG tablet Take 1 tablet (20 mg total) by mouth daily. 12/18/14   Clapacs, Jackquline Denmark, MD    Family History History reviewed. No pertinent family history.  Social History Social History  Substance Use Topics  . Smoking status: Current Every Day Smoker    Packs/day: 0.50    Types: Cigarettes  . Smokeless tobacco: Not on file  . Alcohol use Yes     Comment: occasional     Allergies   Patient has no known allergies.   Review of Systems Review of Systems  Musculoskeletal: Positive for arthralgias.  Skin: Positive for wound.  Neurological: Negative for numbness.     Physical Exam Updated Vital Signs BP 137/85 (BP Location: Right Arm)   Pulse 93   Temp 98 F (36.7 C) (Oral)   Resp 16   SpO2 96%   Physical Exam  Constitutional: He is oriented to person, place, and time. He appears well-developed and well-nourished. No distress.  HENT:  Head: Normocephalic and atraumatic.  Eyes: Pupils are equal, round, and reactive to light. Conjunctivae are normal. Right eye  exhibits no discharge. Left eye exhibits no discharge. No scleral icterus.  Neck: Normal range of motion.  Cardiovascular: Normal rate.   Pulmonary/Chest: Effort normal. No respiratory distress.  Abdominal: He exhibits no distension.  Musculoskeletal:  3 pronged fish hook attached to a lower in the lateral aspect of the right thumb. Full range of motion of thumb.  Neurological: He is alert and oriented to person, place, and time.  Skin: Skin is warm and dry.  Psychiatric: He has a normal mood and affect. His behavior is normal.  Nursing note and vitals reviewed.    ED Treatments / Results  Labs (all labs ordered are listed, but only abnormal results are displayed) Labs Reviewed - No data to display  EKG  EKG Interpretation None       Radiology No results found.  Procedures .Foreign Body Removal Date/Time: 10/27/2016 11:34 AM Performed by: Terance Hart MARIE Authorized by: Bethel Born  Consent: Verbal consent obtained. Risks and benefits: risks, benefits and alternatives were discussed Consent given by: patient Patient understanding: patient states understanding of the procedure being performed Patient consent: the patient's understanding of the procedure matches consent given Site marked: the operative site was marked Imaging studies: imaging studies not available Required items: required blood products, implants, devices, and special equipment available Patient identity confirmed: verbally with patient and arm band Time out:  Immediately prior to procedure a "time out" was called to verify the correct patient, procedure, equipment, support staff and site/side marked as required. Intake: Right thumb. Anesthesia: local infiltration  Anesthesia: Local Anesthetic: lidocaine 2% without epinephrine Anesthetic total: 3 mL  Sedation: Patient sedated: no Patient restrained: no Complexity: simple 1 objects recovered. Objects recovered: Fish hook Post-procedure  assessment: foreign body removed Patient tolerance: Patient tolerated the procedure well with no immediate complications   (including critical care time)    Medications Ordered in ED Medications  lidocaine (XYLOCAINE) 2 % (with pres) injection 400 mg (not administered)  Tdap (BOOSTRIX) injection 0.5 mL (0.5 mLs Intramuscular Given 10/27/16 1053)     Initial Impression / Assessment and Plan / ED Course  I have reviewed the triage vital signs and the nursing notes.  Pertinent labs & imaging results that were available during my care of the patient were reviewed by me and considered in my medical decision making (see chart for details).  36 year old male with retained fish hook injury. Local anesthesia was injected and wire cutters were used to cut the barb and fish hook was removed successfully. Prophylactic antibiotics were given due to location of injury. Tetanus was updated. Return precautions given.  Final Clinical Impressions(s) / ED Diagnoses   Final diagnoses:  Fish hook injury of finger of right hand, initial encounter    New Prescriptions New Prescriptions   No medications on file     Beryle QuantGekas, Aylin Rhoads Marie, PA-C 10/27/16 1139    Donnetta Hutchingook, Brian, MD 10/30/16 1655

## 2016-10-27 NOTE — ED Triage Notes (Signed)
Pt has fish hook in right thumb, causing mild pain. unknown tetanus.

## 2016-10-27 NOTE — Discharge Instructions (Signed)
Take antibiotics as directed.    Return for worsening symptoms.

## 2016-10-27 NOTE — ED Notes (Signed)
Provider given the ring cutter, 18G needle, 25G needle, 10cc syringe and lidocaine with suture removal kit.

## 2017-01-07 ENCOUNTER — Ambulatory Visit (INDEPENDENT_AMBULATORY_CARE_PROVIDER_SITE_OTHER): Payer: Self-pay

## 2017-01-07 ENCOUNTER — Other Ambulatory Visit: Payer: Self-pay | Admitting: Gerontology

## 2017-01-07 DIAGNOSIS — Z021 Encounter for pre-employment examination: Secondary | ICD-10-CM

## 2017-05-07 ENCOUNTER — Inpatient Hospital Stay
Admit: 2017-05-07 | Discharge: 2017-05-07 | Disposition: A | Discharge status: Home / Self Care | Attending: Emergency Medicine

## 2017-05-07 DIAGNOSIS — K0889 Other specified disorders of teeth and supporting structures: Secondary | ICD-10-CM

## 2017-05-07 MED ORDER — PENICILLIN V POTASSIUM 250 MG PO TABS
250 MG | Freq: Once | ORAL | Status: AC
Start: 2017-05-07 — End: 2017-05-07
  Administered 2017-05-07: 19:00:00 500 mg via ORAL

## 2017-05-07 MED ORDER — IBUPROFEN 800 MG PO TABS
800 MG | ORAL_TABLET | Freq: Three times a day (TID) | ORAL | 0 refills | Status: AC | PRN
Start: 2017-05-07 — End: ?

## 2017-05-07 MED ORDER — HYDROCODONE-ACETAMINOPHEN 5-325 MG PO TABS
5-325 MG | ORAL_TABLET | ORAL | 0 refills | Status: AC | PRN
Start: 2017-05-07 — End: 2017-05-10

## 2017-05-07 MED ORDER — IBUPROFEN 800 MG PO TABS
800 MG | Freq: Once | ORAL | Status: AC
Start: 2017-05-07 — End: 2017-05-07
  Administered 2017-05-07: 19:00:00 800 mg via ORAL

## 2017-05-07 MED ORDER — PENICILLIN V POTASSIUM 500 MG PO TABS
500 MG | ORAL_TABLET | Freq: Four times a day (QID) | ORAL | 0 refills | Status: AC
Start: 2017-05-07 — End: 2017-05-17

## 2017-05-07 MED FILL — IBUPROFEN 800 MG PO TABS: 800 mg | ORAL | Qty: 1

## 2017-05-07 MED FILL — PENICILLIN V POTASSIUM 250 MG PO TABS: 250 mg | ORAL | Qty: 2

## 2017-05-07 NOTE — ED Provider Notes (Signed)
[facility]  eMERGENCY dEPARTMENT eNCOUnter      Pt Name: Wayne Giles Herdt  MRN: [id number]  Birthdate 1980/10/18  Date of evaluation: 05/07/17      CHIEF COMPLAINT:   Chief Complaint   Patient presents with   ??? Facial Swelling   ??? Dental Pain     HISTORY OF PRESENT ILLNESS    Wayne Giles Travelstead is a 37 y.o. male who presents with planes of left upper dental pain that began today.  Patient states he has a broken tooth but denies any recent injury.  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  Patient states he looked in the mirror noticed his left side of his face was swollen.  Patient states pain is 10/10.  Denies any fever, chills, trouble breathing, trouble swallowing, cp, sob, nausea, vomiting, abdominal pain.  Patient has not taken anything for pain.  He does not have a Education officer, communitydentist.  He [place] from [place].  No other complaints.         REVIEW OF SYSTEMS     Constitutional: Denies recent fever, chills.  Eyes: No visual changes.    Neck: No neck pain.   Respiratory: Denies recent shortness of breath.    Cardiac:  Denies recent chest pain.    GI: denies any recent abdominal pain nausea or vomiting.  Denies Blood in the stool or black tarry stools.    GU: denies dysuria.    Musculoskeletal: Denies focal weakness.    Neurologic: denies headache or focal weakness.    Skin:  Denies any rash.      Negative in 10 essential Systems except as mentioned above and in the HPI.      PAST MEDICAL HISTORY   PMH:  has a past medical history of ACL (anterior cruciate ligament) tear, Morbid obesity (HCC), and Sciatica. none otherwise stated in nurses notes  Surgical History:  has a past surgical history that includes Appendectomy. none otherwise stated in nurses notes  Social History:  reports that he has been smoking cigarettes.  He has a 5.00 pack-year smoking history. He does not have any smokeless tobacco history on file. He reports that he does not drink alcohol or use drugs. none otherwise stated  in nurses notes  Family History: none otherwise stated in nurses notes  Psychiatric History: none otherwise stated in nurses notes    Allergies:has No Known Allergies.      PHYSICAL EXAM     INITIAL VITALS: BP (!) 147/89    Pulse 112    Temp 97.9 ??F (36.6 ??C) (Oral)    Resp 17    Ht 5\' 9"  (1.753 m)    Wt (!) 330 lb (149.7 kg)    SpO2 98%    BMI 48.73 kg/m??   CONSTITUTIONAL: Vital signs reviewed, Alert and oriented X 3.   HEAD: Atraumatic, Normocephalic.   EYES: Eyes are normal to inspection, Pupils equal, round and reactive to light.   NECK: Normal ROM, No jugular venous distention, No meningeal signs, Cervical spine nontender.   MOUTH:  + dental pain at 16, broken tooth, with no signs of abscess formation or Ludwig sign noted.  No swelling involving the airway at all.  No trismus.  Lips are normal.  No tongue elevation.  No tenderness over floor of mouth.  Controlling secretions.  Speaking in full sentences.  RESPIRATORY CHEST: No respiratory distress.   ABDOMEN: Abdomen is nontender, No distension.   UPPER EXTREMITY:  Inspection normal, No cyanosis.   NEURO: GCS is 15, Speech normal, Memory normal.   SKIN: Skin is warm, Skin is dry.   PSYCHIATRIC: Oriented X 3, Normal affect.       EMERGENCY DEPARTMENT COURSE:   Pain meds and antibiotic prescriptions.  He was instructed to take Motrin while driving.  He can take Norco if he is not driving or drinking alcohol.  Follow-up with dentist.  Strict return instructions provided.    Pt provided with dental clinic list and instructed to call as soon as possible for an appointment.   Instructed to return for worsening or any new symptoms including throat swelling, difficulty swallowing or breathing. Pt agrees.       FINAL IMPRESSION:     1. Pain, dental          DISPOSITION:  DISPOSITION Decision To Discharge 05/07/2017 02:32:44 PM        PATIENT REFERRED TO:  [facility] ED  [address]  [place] [address]  [phone]    If symptoms worsen    dentist  see  list          DISCHARGE MEDICATIONS:  New Prescriptions    HYDROCODONE-ACETAMINOPHEN (NORCO) 5-325 MG PER TABLET    Take 1 tablet by mouth every 4 hours as needed for Pain for up to 3 days. Intended supply: 3 days. Take lowest dose possible to manage pain    IBUPROFEN (ADVIL;MOTRIN) 800 MG TABLET    Take 1 tablet by mouth every 8 hours as needed for Pain    PENICILLIN V POTASSIUM (VEETID) 500 MG TABLET    Take 1 tablet by mouth 4 times daily for 10 days       (Please note that portions of this note were completed with a voice recognition program.  Efforts were made to edit the dictations but occasionally words are mis-transcribed.)    Letta Pate, PA-C        Letta Pate, PA-C  05/07/17 1436       Letta Pate, PA-C  05/07/17 1436

## 2017-05-07 NOTE — Discharge Instructions (Signed)
Dentist and Dental Clinics    Emergency Dental Clinic  [place], [place]  [phone]  [facility]  [address]  [phone]  [facility]  [address]   Pt must have source of income & must bring 2 recent check stubs to appt Call for an appointment  [phone]  Dental Pain or a dental emergency Dentist available 24 hours/7 days a week [phone]  [facility]  (Service for the Homeless)  [address]  [phone]    Dentists who take Medicaid    Connelly, Daniel  [address]  Call 9a - 11a for appointments  [phone]  Finlay, Roy  [address]  Call 9a - 11a for appointments  [phone]  [facility]   Takes FHP w/ PCP referral only   [employer]  D[facility]  Health Technology Bldg   ([address] [facility])   $25 for initial consultation, exam and cleaning    Does not accept Medicaid Monday - Friday  8a - 5p  One evening/week for appointments  Call for an appointment  [phone]    Pediatric Dentists    Stack, Charles  [address] Accepts Medicaid  Only Children 12 and under [phone]  [facility]  Dental Clinic  [address] Only Children 12 and under [phone]  [facility] [facility]  [address] Ages 3 - 18 years only [phone]    Dentists (Non-Medicaid Patients)    Arthur, Steven  [address]  [phone]  Biggs, William  [address]  [phone]  Bronks, Lou  [address]  [phone]  Granana, Michael  [address]  [phone]  [facility]  Khan, Nadeem  Kwong, Michael  [address]  [phone]  Matusiewicz, Tom  [address] Dentures and partials only [phone]  Ott, Robert  [address]  [phone]  Pace, Frank  [address]  [place], [place]  [phone]      Dentists (Non-Medicaid Patients)    VanWay, Bruce  [address]  [place],  [place]  [phone]  Westmeyer, Richard  [address]  [phone]    Oral Surgeons    Dr Lawrence and Dr Loyd  [address] Takes [place] advantage and Medicaid [phone]  Dr Ziegler and Dr Shall  [address] Takes [place] Medicaid patients [phone]    Miscellaneous Information    [employer] First Call for Help  [phone]  Call for an appointment  H.E.L.P   (Hospital Eligibility Link Program)  [phone]   toll free [phone] For financial assistance

## 2017-05-07 NOTE — ED Provider Notes (Signed)
eMERGENCY dEPARTMENT eNCOUnter   Independent Attestation     Pt Name: Wayne Giles  MRN: [id number]  Birthdate 09/06/1980  Date of evaluation: 05/07/17     Wayne Giles is a 37 y.o. male with CC: Facial Swelling and Dental Pain      Based on the medical record the care appears appropriate.  I was personally available for consultation in the Emergency Department.    Wayne AlmasMATTHEW [name], MD  Attending Emergency Physician                    Wayne AlmasMatthew Leasha Goldberger, MD  05/07/17 [phone]

## 2017-05-07 NOTE — ED Notes (Signed)
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  Pt denies any local injuries or trauma.     Pt arrives A+O x 4, GCS = 15, PMS x 4 intact, eupneic, and PWD.     Mingo AmberMatthew J Zelta Enfield, RN  05/07/17 [phone]

## 2017-08-11 DIAGNOSIS — G8929 Other chronic pain: Secondary | ICD-10-CM | POA: Insufficient documentation

## 2017-08-11 DIAGNOSIS — M25561 Pain in right knee: Secondary | ICD-10-CM | POA: Insufficient documentation

## 2018-04-19 DIAGNOSIS — M6788 Other specified disorders of synovium and tendon, other site: Secondary | ICD-10-CM | POA: Insufficient documentation

## 2018-04-19 DIAGNOSIS — M79671 Pain in right foot: Secondary | ICD-10-CM | POA: Insufficient documentation

## 2018-04-19 DIAGNOSIS — Q688 Other specified congenital musculoskeletal deformities: Secondary | ICD-10-CM | POA: Insufficient documentation

## 2018-12-26 DIAGNOSIS — M66371 Spontaneous rupture of flexor tendons, right ankle and foot: Secondary | ICD-10-CM | POA: Insufficient documentation

## 2018-12-26 DIAGNOSIS — R937 Abnormal findings on diagnostic imaging of other parts of musculoskeletal system: Secondary | ICD-10-CM | POA: Insufficient documentation

## 2019-02-23 HISTORY — PX: ANKLE SURGERY: SHX546

## 2020-04-28 ENCOUNTER — Ambulatory Visit (INDEPENDENT_AMBULATORY_CARE_PROVIDER_SITE_OTHER): Payer: Commercial Managed Care - PPO | Admitting: Family Medicine

## 2020-04-28 ENCOUNTER — Ambulatory Visit (INDEPENDENT_AMBULATORY_CARE_PROVIDER_SITE_OTHER): Payer: Commercial Managed Care - PPO

## 2020-04-28 ENCOUNTER — Encounter: Payer: Self-pay | Admitting: Family Medicine

## 2020-04-28 ENCOUNTER — Other Ambulatory Visit: Payer: Self-pay

## 2020-04-28 VITALS — BP 116/72 | HR 85 | Temp 97.7°F | Ht 69.0 in | Wt 340.2 lb

## 2020-04-28 DIAGNOSIS — Z114 Encounter for screening for human immunodeficiency virus [HIV]: Secondary | ICD-10-CM

## 2020-04-28 DIAGNOSIS — R0789 Other chest pain: Secondary | ICD-10-CM

## 2020-04-28 DIAGNOSIS — G8929 Other chronic pain: Secondary | ICD-10-CM

## 2020-04-28 DIAGNOSIS — M25561 Pain in right knee: Secondary | ICD-10-CM | POA: Diagnosis not present

## 2020-04-28 DIAGNOSIS — Z0000 Encounter for general adult medical examination without abnormal findings: Secondary | ICD-10-CM | POA: Diagnosis not present

## 2020-04-28 DIAGNOSIS — Z1159 Encounter for screening for other viral diseases: Secondary | ICD-10-CM

## 2020-04-28 DIAGNOSIS — M25512 Pain in left shoulder: Secondary | ICD-10-CM

## 2020-04-28 NOTE — Progress Notes (Signed)
Patient: Bradley Barr MRN: [id number] DOB: [birth year] PCP: Pcp, No     Subjective:  Chief Complaint  Patient presents with  . New Patient (Initial Visit)  . Annual Exam  . left shoulder pain  . Abdominal Pain  . right knee pain  . Obesity    HPI: The patient is a 40 y.o. male who presents today for annual exam. He denies any changes to past medical history. There have been no recent hospitalizations. They are not following a well balanced diet and exercise plan. Weight has been increasing steadily.  He has a history of fatty liver disease, depression and chronic pain in his right knee. 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   No family hx of colon or breast cancer in first degree. Does not know fathers hx well. Maternal grandmother had breast cancer.    1) left rotator cuff issues He can not lift his left arm above shoulder. He had an injury years ago, but it healed up. He would have flares, but it would get better. He is right handed. He has had increased pain and flare lasting all month and it's getting worse. He can no turn his arm, put his arm behind his back. Pain rated as an 8-9/10. Marland Kitchen. He has tried tylenol and ibuprofen. Will help him sleep, but will still wake up with pain. He states he just started to have burning on the inside too which is new.   2) right knee issues. He has hx of chronic knee pain.  He hurt it at work and was under [employer]. He was supposed to have surgery, but he was fired and it was never fixed. He thinks he tore this meniscus and something else. He states fluid builds up really bad on it and his whole leg can swell up. He states the pain is getting unbearable and it's hard to get up and down into his truck. He states pain comes and goes and he doesn't really know what flairs it.   3) chest pain at rest.  He states he noticed this a year ago. He will have tightness in his chest and pressure, no radiation down left arm or up the jaw.  No sweating or sweating, but will get a light headed. Episode will last less than 5 minutes.  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 Doesn't seem to happen as much with exertion, just more fatigue. No hx of smoking, but remote history and stopped in 03/2018. Hx of PPD for 6 years. No hx of diabetes. No hx of HTN. Hx of heart disease in his paternal grandfather at age 38 years.   4) obesity.  Really affecting his self esteem. Knows he needs to get weight off. Interested in referral for weight management.   Immunization History  Administered Date(s) Administered  . PFIZER(Purple Top)SARS-COV-2 Vaccination 11/01/2019, 11/22/2019  . Tdap 10/27/2016   Colonoscopy: routine screening PSA: routine screening  Review of Systems  Constitutional: Negative for chills, fatigue and fever.  HENT: Negative for dental problem, ear pain, hearing loss and trouble swallowing.   Eyes: Negative for visual disturbance.  Respiratory: Negative for cough, chest tightness and shortness of breath.   Cardiovascular: Positive for chest pain. Negative for palpitations and leg swelling.  Gastrointestinal: Negative for abdominal pain, blood in stool, diarrhea and nausea.  Endocrine: Negative for cold intolerance, polydipsia, polyphagia and polyuria.  Genitourinary: Negative for dysuria and hematuria.  Musculoskeletal: Positive for arthralgias.  Skin: Negative for rash.  Neurological: Negative for dizziness and headaches.  Psychiatric/Behavioral: Negative for dysphoric mood and sleep disturbance. The patient is not nervous/anxious.     Allergies Patient has No Known Allergies.  Past Medical History Patient  has a past medical history of Allergy, Anxiety, Depression, GERD (gastroesophageal reflux disease), Kidney stones, Pancreatitis, and Pancreatitis (2016).  Surgical History Patient  has a past surgical history that includes Appendectomy and Ankle surgery (2021).  Family History Pateint's  family history includes ADD / ADHD in his maternal grandfather; Alcohol abuse in his maternal grandfather; Arthritis in his maternal grandmother and mother; Asthma in his maternal grandmother and mother; COPD in his maternal grandmother and mother; Cancer in his maternal grandfather and maternal grandmother; Depression in his mother; Diabetes in his maternal grandfather; Early death in his paternal grandfather; Hearing loss in his maternal grandfather and maternal grandmother; Heart disease in his maternal grandfather and maternal grandmother; Hyperlipidemia in his maternal grandfather and maternal grandmother; Stroke in his maternal grandmother, paternal grandfather, and paternal grandmother.  Social History Patient  reports that he has quit smoking. His smoking use included cigarettes. He smoked 2.00 packs per day. He has never used smokeless tobacco. He reports current alcohol use. He reports current drug use. Drug: Marijuana.    Objective: Vitals:   04/28/20 0855  BP: 116/72  Pulse: 85  Temp: 97.7 F (36.5 C)  TempSrc: Temporal  SpO2: 98%  Weight: (!) 340 lb 3.2 oz (154.3 kg)  Height: 5\' 9"  (1.753 m)    Body mass index is 50.24 kg/m.  Physical Exam Vitals reviewed.  Constitutional:      Appearance: Normal appearance. He is well-developed and well-nourished. He is obese.  HENT:     Head: Normocephalic and atraumatic.     Right Ear: Tympanic membrane, ear canal and external ear normal.     Left Ear: Tympanic membrane, ear canal and external ear normal.     Mouth/Throat:     Mouth: Oropharynx is clear and moist. Mucous membranes are moist.  Eyes:     Extraocular Movements: Extraocular movements intact and EOM normal.     Conjunctiva/sclera: Conjunctivae normal.     Pupils: Pupils are equal, round, and reactive to light.  Neck:     Thyroid: No thyromegaly.  Cardiovascular:     Rate and Rhythm: Normal rate and regular rhythm.     Pulses: Normal pulses and intact distal pulses.      Heart sounds: Normal heart sounds. No murmur heard.   Pulmonary:     Effort: Pulmonary effort is normal.     Breath sounds: Normal breath sounds.  Abdominal:     General: Bowel sounds are normal. There is no distension.     Palpations: Abdomen is soft.     Tenderness: There is no abdominal tenderness.  Musculoskeletal:     Left shoulder: Normal. No swelling or bony tenderness. Normal strength.     Cervical back: Normal range of motion and neck supple.     Right knee: No swelling. Normal range of motion. No tenderness. No LCL laxity, MCL laxity, ACL laxity or PCL laxity.     Comments: +passive arc, +gerbers sign. Negative jobes test  Lymphadenopathy:     Cervical: No cervical adenopathy.  Skin:    General: Skin is warm and dry.     Capillary Refill: Capillary refill takes less than 2 seconds.     Findings: No rash.  Neurological:     General: No focal deficit  present.     Mental Status: He is alert and oriented to person, place, and time.     Cranial Nerves: No cranial nerve deficit.     Coordination: Coordination normal.     Deep Tendon Reflexes: Reflexes normal.  Psychiatric:        Mood and Affect: Mood and affect and mood normal.        Behavior: Behavior normal.     Ekg: NSR with rate of 83   GAD 7 : Generalized Anxiety Score 04/28/2020  Nervous, Anxious, on Edge 2  Control/stop worrying 3  Worry too much - different things 3  Trouble relaxing 3  Restless 2  Easily annoyed or irritable 3  Afraid - awful might happen 2  Total GAD 7 Score 18  Anxiety Difficulty Not difficult at all       Flowsheet Row Office Visit from 04/28/2020 in [facility]  PHQ-2 Total Score 0    knee xray:  No acute findings. Official read pending.   Shoulder xray:  No acute fracture. offiicial read pending.    Assessment/plan: 1. Annual physical exam He will come back for fasting labs, otherwise UTD. He really needs to work on weight loss and exercise. 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 Interested in weight management and referral placed. Discussed sleep hygiene, food choices, etc.  Patient counseling [x]    Nutrition: Stressed importance of moderation in sodium/caffeine intake, saturated fat and cholesterol, caloric balance, sufficient intake of fresh fruits, vegetables, fiber, calcium, iron, and 1 mg of folate supplement per day (for females capable of pregnancy).  [x]    Stressed the importance of regular exercise.   []    Substance Abuse: Discussed cessation/primary prevention of tobacco, alcohol, or other drug use; driving or other dangerous activities under the influence; availability of treatment for abuse.   [x]    Injury prevention: Discussed safety belts, safety helmets, smoke detector, smoking near bedding or upholstery.   [x]    Sexuality: Discussed sexually transmitted diseases, partner selection, use of condoms, avoidance of unintended pregnancy  and contraceptive alternatives.  [x]    Dental health: Discussed importance of regular tooth brushing, flossing, and dental visits.  [x]    Health maintenance and immunizations reviewed. Please refer to Health maintenance section.    - CBC with Differential/Platelet; Future - Comprehensive metabolic panel; Future - Lipid panel; Future - TSH; Future  2. Encounter for screening for HIV  - HIV Antibody (routine testing w rflx); Future  3. Encounter for hepatitis C screening test for low risk patient  - Hepatitis C antibody; Future  4. Chronic left shoulder pain Concern for rotator cuff injury. MRI ordered.  - DG Shoulder Left; Future - MR Shoulder Left Wo Contrast; Future  5. Other chest pain GAD7 severe and chest pain appears atypical. With obesity and smoking will stress him to rule out cardiac and have him come back for anxiety. Precautions given.  - EKG 12-Lead - Exercise Tolerance Test; Future - Cardiac Stress Test: Informed Consent Details: Physician/Practitioner Attestation;  Transcribe to consent form and obtain patient signature  6. Chronic pain of right knee Hx of meniscal tear and was supposed to get repaired. Intermittent pain. voltaren gel qid and will order MRI.  - DG Knee Complete 4 Views Right; Future - MR Knee Right Wo Contrast; Future  7. Morbid obesity (HCC)  - Amb Ref to Medical Weight Management   This visit occurred during the SARS-CoV-2 public health emergency.  Safety protocols were in place, including screening  questions prior to the visit, additional usage of staff PPE, and extensive cleaning of exam room while observing appropriate contact time as indicated for disinfecting solutions.   Total time of encounter: 55 minutes total time of encounter, including 40 minutes spent in face-to-face patient care. This time includes coordination of care and counseling regarding preventative care as well as acute complaints and work up. Remainder of non-face-to-face time involved reviewing chart documents/testing relevant to the patient encounter and documentation in the medical record.   Return in about 1 month (around 05/29/2020) for anxiety .     Orland Mustard, MD  [facility]  04/28/2020

## 2020-04-28 NOTE — Patient Instructions (Signed)
Come back for fasting labs. Need lab appointment black coffee and water are okay, but no food or drink x 8 hours.   -referral placed for obesity medicine. They will call you with this. Check pricing.   -xray of shoulder and knee today. Then also ordered MRI of these. I like voltaren gel over the counter for knee. It's a topical anti inflammatory. Can ice 20 minutes a few times a day as well if having swelling. Can also do motrin or aleve as needed for both shoulder and knee.   Chest pain ordered a stress test on you. Doing ekg today and checking labs. We can treat for anxiety as well, but will have you come back for this. Any chest pain with radiation to left arm, jaw, shortness of breath: ER.   Come back in one month for anxiety.    Preventive Care 38-4 Years Old, Male Preventive care refers to lifestyle choices and visits with your health care provider that can promote health and wellness. This includes:  A yearly physical exam. This is also called an annual wellness visit.  Regular dental and eye exams.  Immunizations.  Screening for certain conditions.  Healthy lifestyle choices, such as: ? Eating a healthy diet. ? Getting regular exercise. ? Not using drugs or products that contain nicotine and tobacco. ? Limiting alcohol use. What can I expect for my preventive care visit? Physical exam Your health care provider may check your:  Height and weight. These may be used to calculate your BMI (body mass index). BMI is a measurement that tells if you are at a healthy weight.  Heart rate and blood pressure.  Body temperature.  Skin for abnormal spots. Counseling Your health care provider may ask you questions about your:  Past medical problems.  Family's medical history.  Alcohol, tobacco, and drug use.  Emotional well-being.  Home life and relationship well-being.  Sexual activity.  Diet, exercise, and sleep habits.  Work and work Astronomer.  Access to  firearms. What immunizations do I need? Vaccines are usually given at various ages, according to a schedule. Your health care provider will recommend vaccines for you based on your age, medical history, and lifestyle or other factors, such as travel or where you work.   What tests do I need? Blood tests  Lipid and cholesterol levels. These may be checked every 5 years starting at age 14.  Hepatitis C test.  Hepatitis B test. Screening  Diabetes screening. This is done by checking your blood sugar (glucose) after you have not eaten for a while (fasting).  Genital exam to check for testicular cancer or hernias.  STD (sexually transmitted disease) testing, if you are at risk. Talk with your health care provider about your test results, treatment options, and if necessary, the need for more tests.   Follow these instructions at home: Eating and drinking  Eat a healthy diet that includes fresh fruits and vegetables, whole grains, lean protein, and low-fat dairy products.  Drink enough fluid to keep your urine pale yellow.  Take vitamin and mineral supplements as recommended by your health care provider.  Do not drink alcohol if your health care provider tells you not to drink.  If you drink alcohol: ? Limit how much you have to 0-2 drinks a day. ? Be aware of how much alcohol is in your drink. In the [place] one drink equals one 12 oz bottle of beer (355 mL), one 5 oz glass of wine (148 mL), or  one 1 oz glass of hard liquor (44 mL).   Lifestyle  Take daily care of your teeth and gums. Brush your teeth every morning and night with fluoride toothpaste. Floss one time each day.  Stay active. Exercise for at least 30 minutes 5 or more days each week.  Do not use any products that contain nicotine or tobacco, such as cigarettes, e-cigarettes, and chewing tobacco. If you need help quitting, ask your health care provider.  Do not use drugs.  If you are sexually active, practice safe  sex. Use a condom or other form of protection to prevent STIs (sexually transmitted infections).  Find healthy ways to cope with stress, such as: ? Meditation, yoga, or listening to music. ? Journaling. ? Talking to a trusted person. ? Spending time with friends and family. Safety  Always wear your seat belt while driving or riding in a vehicle.  Do not drive: ? If you have been drinking alcohol. Do not ride with someone who has been drinking. ? When you are tired or distracted. ? While texting.  Wear a helmet and other protective equipment during sports activities.  If you have firearms in your house, make sure you follow all gun safety procedures.  Seek help if you have been physically or sexually abused. What's next?  Go to your health care provider once a year for an annual wellness visit.  Ask your health care provider how often you should have your eyes and teeth checked.  Stay up to date on all vaccines. This information is not intended to replace advice given to you by your health care provider. Make sure you discuss any questions you have with your health care provider. Document Revised: 10/25/2018 Document Reviewed: 02/02/2018 Elsevier Patient Education  2021 [employer].

## 2020-05-12 ENCOUNTER — Telehealth: Payer: Self-pay | Admitting: Family Medicine

## 2020-05-12 NOTE — Telephone Encounter (Signed)
Dr. Artis Flock need the order for the GXT extend out til 06/27/20.   Patient is schedule for 06-26-20 to have the test done.  This is the only time we could agree on due to his work schedule.

## 2020-05-12 NOTE — Telephone Encounter (Signed)
I extended the order, but it says it expires in 2023. Not sure if Im missing something?  This is for the exercise stress test correct?  Thanks so much,  D[employer]r [employer]

## 2020-05-15 ENCOUNTER — Telehealth: Payer: Self-pay

## 2020-05-15 NOTE — Telephone Encounter (Signed)
I spoke with the pt about his concerns. He says that he was treated in Urgent care for symptoms of a UTI and STD's. He complains of burning, and hematuria. He says that his results were negative, but was treated anyway. He is still having lower back pain, and shooting pain in lower abdomen. He describes pain as being dull and achy, lower back pain mostly right side.  He has concerns of it being a kidney stone, but does not think it is. He says the pain comes and goes. He also complains of dizziness as well.  He says that he is so fatigue, he is not able to eat at times. Pt was told to go to the ED if symptoms worsen, but in the meantime keep his already scheduled appointment for 05/29/20.

## 2020-05-15 NOTE — Telephone Encounter (Signed)
Sounds like he may have prostatitis. Anyway he can be seen or go get a PSA level drawn somewhere?  Dr. Artis Flock

## 2020-05-15 NOTE — Telephone Encounter (Signed)
Pt has been having issues he states with his health. He went [place] an urgent care in another [place], but would like to talk to Dr. Artis Flock or her team about some of the things going on. He is requesting a phone call, since he won't be back into [place] til April 6.

## 2020-05-16 NOTE — Telephone Encounter (Signed)
Also ask if they tested for STDs.

## 2020-05-16 NOTE — Telephone Encounter (Signed)
Pt did say that he was screened for STD's. However, there were only 3 done.

## 2020-05-29 ENCOUNTER — Encounter: Payer: Self-pay | Admitting: Family Medicine

## 2020-05-29 ENCOUNTER — Ambulatory Visit
Admission: RE | Admit: 2020-05-29 | Discharge: 2020-05-29 | Disposition: A | Discharge status: Home / Self Care | Payer: Commercial Managed Care - PPO | Source: Ambulatory Visit | Attending: Family Medicine | Admitting: Family Medicine

## 2020-05-29 ENCOUNTER — Ambulatory Visit (INDEPENDENT_AMBULATORY_CARE_PROVIDER_SITE_OTHER): Payer: Commercial Managed Care - PPO | Admitting: Family Medicine

## 2020-05-29 ENCOUNTER — Other Ambulatory Visit (HOSPITAL_COMMUNITY)
Admission: RE | Admit: 2020-05-29 | Discharge: 2020-05-29 | Disposition: A | Discharge status: Home / Self Care | Payer: Commercial Managed Care - PPO | Source: Ambulatory Visit | Attending: Family Medicine | Admitting: Family Medicine

## 2020-05-29 ENCOUNTER — Other Ambulatory Visit: Payer: Self-pay

## 2020-05-29 VITALS — BP 128/72 | HR 78 | Temp 99.2°F | Ht 69.0 in | Wt 341.0 lb

## 2020-05-29 DIAGNOSIS — F411 Generalized anxiety disorder: Secondary | ICD-10-CM

## 2020-05-29 DIAGNOSIS — Z114 Encounter for screening for human immunodeficiency virus [HIV]: Secondary | ICD-10-CM

## 2020-05-29 DIAGNOSIS — N4889 Other specified disorders of penis: Secondary | ICD-10-CM | POA: Insufficient documentation

## 2020-05-29 DIAGNOSIS — Z1159 Encounter for screening for other viral diseases: Secondary | ICD-10-CM | POA: Diagnosis not present

## 2020-05-29 DIAGNOSIS — Z0000 Encounter for general adult medical examination without abnormal findings: Secondary | ICD-10-CM

## 2020-05-29 DIAGNOSIS — M25561 Pain in right knee: Secondary | ICD-10-CM

## 2020-05-29 DIAGNOSIS — G8929 Other chronic pain: Secondary | ICD-10-CM

## 2020-05-29 DIAGNOSIS — M25512 Pain in left shoulder: Secondary | ICD-10-CM

## 2020-05-29 LAB — URINALYSIS, ROUTINE W REFLEX MICROSCOPIC
Bilirubin Urine: NEGATIVE
Hgb urine dipstick: NEGATIVE
Ketones, ur: NEGATIVE
Leukocytes,Ua: NEGATIVE
Nitrite: NEGATIVE
Specific Gravity, Urine: 1.02 (ref 1.000–1.030)
Total Protein, Urine: NEGATIVE
Urine Glucose: NEGATIVE
Urobilinogen, UA: 0.2 (ref 0.0–1.0)
pH: 6 (ref 5.0–8.0)

## 2020-05-29 LAB — COMPREHENSIVE METABOLIC PANEL
ALT: 35 U/L (ref 0–53)
AST: 15 U/L (ref 0–37)
Albumin: 4.3 g/dL (ref 3.5–5.2)
Alkaline Phosphatase: 57 U/L (ref 39–117)
BUN: 18 mg/dL (ref 6–23)
CO2: 27 mEq/L (ref 19–32)
Calcium: 9.3 mg/dL (ref 8.4–10.5)
Chloride: 106 mEq/L (ref 96–112)
Creatinine, Ser: 1 mg/dL (ref 0.40–1.50)
GFR: 96.26 mL/min (ref >60.00)
Glucose, Bld: 98 mg/dL (ref 70–99)
Potassium: 4.3 mEq/L (ref 3.5–5.1)
Sodium: 141 mEq/L (ref 135–145)
Total Bilirubin: 0.5 mg/dL (ref 0.2–1.2)
Total Protein: 6.9 g/dL (ref 6.0–8.3)

## 2020-05-29 LAB — CBC WITH DIFFERENTIAL/PLATELET
Basophils Absolute: 0.1 10*3/uL (ref 0.0–0.1)
Basophils Relative: 0.9 % (ref 0.0–3.0)
Eosinophils Absolute: 0.5 10*3/uL (ref 0.0–0.7)
Eosinophils Relative: 7.5 % — ABNORMAL HIGH (ref 0.0–5.0)
HCT: 43.6 % (ref 39.0–52.0)
Hemoglobin: 15.1 g/dL (ref 13.0–17.0)
Lymphocytes Relative: 19.8 % (ref 12.0–46.0)
Lymphs Abs: 1.4 10*3/uL (ref 0.7–4.0)
MCHC: 34.5 g/dL (ref 30.0–36.0)
MCV: 89.9 fl (ref 78.0–100.0)
Monocytes Absolute: 0.6 10*3/uL (ref 0.1–1.0)
Monocytes Relative: 8.6 % (ref 3.0–12.0)
Neutro Abs: 4.3 10*3/uL (ref 1.4–7.7)
Neutrophils Relative %: 63.2 % (ref 43.0–77.0)
Platelets: 162 10*3/uL (ref 150.0–400.0)
RBC: 4.85 Mil/uL (ref 4.22–5.81)
RDW: 13.1 % (ref 11.5–15.5)
WBC: 6.9 10*3/uL (ref 4.0–10.5)

## 2020-05-29 LAB — LIPID PANEL
Cholesterol: 125 mg/dL (ref 0–200)
HDL: 35.1 mg/dL — ABNORMAL LOW (ref >39.00)
LDL Cholesterol: 71 mg/dL (ref 0–99)
NonHDL: 89.75
Total CHOL/HDL Ratio: 4
Triglycerides: 93 mg/dL (ref 0.0–149.0)
VLDL: 18.6 mg/dL (ref 0.0–40.0)

## 2020-05-29 LAB — PSA: PSA: 0.21 ng/mL (ref 0.10–4.00)

## 2020-05-29 LAB — TSH: TSH: 1.11 u[IU]/mL (ref 0.35–4.50)

## 2020-05-29 MED ORDER — FLUOXETINE HCL 20 MG PO TABS: 20.0000 mg | ORAL_TABLET | Freq: Every day | ORAL | 3 refills | Status: DC

## 2020-05-29 NOTE — Progress Notes (Signed)
Patient: Bradley Barr MRN: [id number] DOB: [birth year] PCP: Orland Mustard, MD     Subjective:  Chief Complaint  Patient presents with  . Anxiety  . Penis Pain    Pt says that he is a little better, after taking an antibiotic but the pain still comes and goes.    HPI: The patient is a 40 y.o. male who presents today for Anxiety and penis pain that comes and goes. He also states that he has shooting pain in his testicles, but not currently.   Anxiety I saw him for a new patient appointment a few weeks ago, but we had so much to address that was more acute I had him come back for this. His GAD7 score was an 18.  He is trying to work out more, even more so on the road. FH of anxiety in his mother. No hx of panic attacks. He does have periods of more intense anxiety. Never been on medication in the past. He thinks his anxiety is combo of a lot of things. He likely has had anxiety > 3-4 years. He also states his depression comes and goes. He will have periods of being more angry. He will eat comfort foods. He does have problems with sleep. He has no SI/HI/AH/VH. He was on celexa years ago and doesn't even remember taking it. He does have hx of hospitalization for mental health issues after his grandmother died. They kept him for 12 days. He states he was on drugs and young and made some bad choices. He never tried to kill himself, his mom was just worried about him.   Dysuria and penile pain Intermittent pain. Sometimes he only has pain in the head of his penis. Pain started a few weeks ago and he went to urgent care. He states hew as tested for GC/C/trich as well as UTI. These all came back negative. They did not check a PSA. He states he has not had blood in his urine, but he did have blood in his penis. He had no urgency or frequency. No pain with defecation. He has had sex and it doesn't feel normal/as good. He thinks he may have had some blood in his sperm. He has had no fever/chills,  CVA tenderness. He was given some abx from urgent care. They treated him for a STD (azithromycin) and some abx for a UTI, but he stopped this when his urine culture came back negative. Pain is not as bad since it started. Denies any issues with testicles (no swelling or pain). He has no skin lesion, itching, discharge, odor.    He is sexually active with a few people does not always use protection.    Review of Systems  Constitutional: Negative for diaphoresis, fatigue and fever.  Genitourinary: Positive for penile pain. Negative for difficulty urinating, dysuria, flank pain, frequency, genital sores, hematuria, penile discharge, penile swelling, scrotal swelling, testicular pain and urgency.  Musculoskeletal: Positive for arthralgias.  Skin: Negative for rash and wound.  Psychiatric/Behavioral: Positive for sleep disturbance. Negative for behavioral problems, confusion, decreased concentration, dysphoric mood, hallucinations, self-injury and suicidal ideas. The patient is nervous/anxious. The patient is not hyperactive.     Allergies Patient has No Known Allergies.  Past Medical History Patient  has a past medical history of Allergy, Anxiety, Depression, GERD (gastroesophageal reflux disease), Kidney stones, Pancreatitis, and Pancreatitis (2016).  Surgical History Patient  has a past surgical history that includes Appendectomy and Ankle surgery (2021).  Family History Pateint's family history  includes ADD / ADHD in his maternal grandfather; Alcohol abuse in his maternal grandfather; Arthritis in his maternal grandmother and mother; Asthma in his maternal grandmother and mother; COPD in his maternal grandmother and mother; Cancer in his maternal grandfather and maternal grandmother; Depression in his mother; Diabetes in his maternal grandfather; Early death in his paternal grandfather; Hearing loss in his maternal grandfather and maternal grandmother; Heart disease in his maternal grandfather  and maternal grandmother; Hyperlipidemia in his maternal grandfather and maternal grandmother; Stroke in his maternal grandmother, paternal grandfather, and paternal grandmother.  Social History Patient  reports that he has quit smoking. His smoking use included cigarettes. He smoked 2.00 packs per day. He has never used smokeless tobacco. He reports current alcohol use. He reports current drug use. Drug: Marijuana.    Objective: Vitals:   05/29/20 0858  BP: 128/72  Pulse: 78  Temp: 99.2 F (37.3 C)  TempSrc: Temporal  SpO2: 97%  Weight: (!) 341 lb (154.7 kg)  Height: 5\' 9"  (1.753 m)    Body mass index is 50.36 kg/m.  Physical Exam Vitals reviewed. Exam conducted with a chaperone present.  Constitutional:      General: He is not in acute distress.    Appearance: Normal appearance. He is obese. He is not ill-appearing.  HENT:     Head: Normocephalic and atraumatic.  Cardiovascular:     Rate and Rhythm: Normal rate and regular rhythm.     Heart sounds: Normal heart sounds.  Pulmonary:     Effort: Pulmonary effort is normal.     Breath sounds: Normal breath sounds.  Abdominal:     General: Bowel sounds are normal.     Palpations: Abdomen is soft.  Genitourinary:    Penis: Normal.      Comments: No erythema, edema, or discharge from penis.  Neurological:     General: No focal deficit present.     Mental Status: He is alert and oriented to person, place, and time.  Psychiatric:        Mood and Affect: Mood normal.        Behavior: Behavior normal.     Comments: No si/hi/ah/vh         GAD 7 : Generalized Anxiety Score 04/28/2020  Nervous, Anxious, on Edge 2  Control/stop worrying 3  Worry too much - different things 3  Trouble relaxing 3  Restless 2  Easily annoyed or irritable 3  Afraid - awful might happen 2  Total GAD 7 Score 18  Anxiety Difficulty Not difficult at all     Assessment/plan: 1. GAD (generalized anxiety disorder) GAD7 score is severe.  Starting im on SSRI prozac. No panic attacks. Has been on SSRI in past, but doesn't recall this. I've explained to him that drugs of the SSRI class can have side effects such as weight gain, sexual dysfunction, insomnia, headache, nausea. These medications are generally effective at alleviating symptoms of anxiety and/or depression. Let me know if significant side effects do occur. Any si/hi/ he is to call 911 or go to ER. Trying to work out more and eat better as well. Close f/u with me in 1 month.   2. Penile pain -prostatitis vs. Infection vs. Pudendal nerve issue. No suspicion for balanitis at this time. Did have blood come out if he squeezed shaft of penis which has resolved. . If w/u negative, sending to urology.   - PSA - RPR - Urinalysis, Routine w reflex microscopic - Urine cytology ancillary only([facility])   -checking all labs today from his annual, never came back to have these done.    This visit occurred during the SARS-CoV-2 public health emergency.  Safety protocols were in place, including screening questions prior to the visit, additional usage of staff PPE, and extensive cleaning of exam room while observing appropriate contact time as indicated for disinfecting solutions.     Return in about 1 month (around 06/28/2020) for virtual with Arlisha Patalano for anxiety .     Orland Mustard, MD [facility]  05/29/2020

## 2020-05-29 NOTE — Patient Instructions (Signed)
-  starting you on prozac for anxiety. Lower dose at 20mg /day. Take this every AM. We can increase dosage if we need to. Takes about 3-4 weeks to get into your system, so don't expect a miracle overnight. Will see you back in 1 month for recheck. Virtual is okay for this.   -lots of labs/urine today. If work up negative, sending to urology.   See you in 1 month! Dr. 

## 2020-05-30 ENCOUNTER — Encounter (HOSPITAL_BASED_OUTPATIENT_CLINIC_OR_DEPARTMENT_OTHER): Payer: Self-pay | Admitting: Emergency Medicine

## 2020-05-30 ENCOUNTER — Other Ambulatory Visit: Payer: Self-pay | Admitting: Family Medicine

## 2020-05-30 ENCOUNTER — Other Ambulatory Visit: Payer: Self-pay

## 2020-05-30 ENCOUNTER — Emergency Department (HOSPITAL_BASED_OUTPATIENT_CLINIC_OR_DEPARTMENT_OTHER)
Admission: EM | Admit: 2020-05-30 | Discharge: 2020-05-31 | Disposition: A | Discharge status: Home / Self Care | Payer: Commercial Managed Care - PPO | Attending: Emergency Medicine | Admitting: Emergency Medicine

## 2020-05-30 DIAGNOSIS — K625 Hemorrhage of anus and rectum: Secondary | ICD-10-CM | POA: Diagnosis present

## 2020-05-30 DIAGNOSIS — Z87891 Personal history of nicotine dependence: Secondary | ICD-10-CM | POA: Diagnosis not present

## 2020-05-30 DIAGNOSIS — N4889 Other specified disorders of penis: Secondary | ICD-10-CM

## 2020-05-30 DIAGNOSIS — K602 Anal fissure, unspecified: Secondary | ICD-10-CM | POA: Diagnosis not present

## 2020-05-30 DIAGNOSIS — R110 Nausea: Secondary | ICD-10-CM | POA: Diagnosis not present

## 2020-05-30 LAB — HEPATITIS C ANTIBODY
Hepatitis C Ab: NONREACTIVE
SIGNAL TO CUT-OFF: 0.63 (ref <1.00)

## 2020-05-30 LAB — HIV ANTIBODY (ROUTINE TESTING W REFLEX): HIV 1&2 Ab, 4th Generation: NONREACTIVE

## 2020-05-30 LAB — RPR: RPR Ser Ql: NONREACTIVE

## 2020-05-30 NOTE — ED Triage Notes (Signed)
  Patient comes in for rectal bleeding that started around 2000.  Patient states he had abdominal pain and went to have a BM and noted bright colored blood with blood clots.  Patient has had two occurrences since.  Hx of rectal bleeding several years ago but not this severe.  Was seen earlier in the week for penile pain and bloody discharge, was tested for STDs.  4/10 pain going across abdomen and R flank.  Feels nauseous but no emesis.  Family hx of diverticulitis.

## 2020-05-30 NOTE — ED Provider Notes (Signed)
[facility] EMERGENCY DEPARTMENT Provider Note  CSN: [id number] Arrival date & time: 05/30/20 2248    History Chief Complaint  Patient presents with  . Rectal Bleeding    HPI  Bradley Barr is a 40 y.o. male presents for evaluation of rectal bleeding. Patient reports he has had some mild burning in his abdomen for a few days. Tonight he went to have a BM and noticed some blood mixed with stool. A few minutes later he felt the need to go to the bathroom again and then had only blood, bright red with some small clots mixed in. He has not felt lightheaded or dizzy but did have some nausea. No prior history of same. Has not been constipated lately. He has had some penile discomfort and bloody discharge the last 2 weeks, seen at [facility] and then at PCP yesterday for same, workup so far has been negative including STD and UA x 2. He will be referred to Urology for evaluation.    Past Medical History:  Diagnosis Date  . Allergy   . Anxiety   . Depression   . GERD (gastroesophageal reflux disease)   . Kidney stones   . Pancreatitis   . Pancreatitis 2016    Past Surgical History:  Procedure Laterality Date  . ANKLE SURGERY  2021  . APPENDECTOMY      Family History  Problem Relation Age of Onset  . Arthritis Mother   . Asthma Mother   . Depression Mother   . COPD Mother   . Arthritis Maternal Grandmother   . Asthma Maternal Grandmother   . COPD Maternal Grandmother   . Heart disease Maternal Grandmother   . Hearing loss Maternal Grandmother   . Hyperlipidemia Maternal Grandmother   . Stroke Maternal Grandmother   . Cancer Maternal Grandmother   . Hearing loss Maternal Grandfather   . Heart disease Maternal Grandfather   . Hyperlipidemia Maternal Grandfather   . Diabetes Maternal Grandfather   . ADD / ADHD Maternal Grandfather   . Alcohol abuse Maternal Grandfather   . Cancer Maternal Grandfather   . Stroke Paternal Grandmother   . Stroke Paternal  Grandfather   . Early death Paternal Grandfather     Social History   Tobacco Use  . Smoking status: Former Smoker    Packs/day: 2.00    Types: Cigarettes  . Smokeless tobacco: Never Used  Vaping Use  . Vaping Use: Never used  Substance Use Topics  . Alcohol use: Yes    Comment: occasional  . Drug use: Yes    Types: Marijuana    Comment: used in the past      Home Medications Prior to Admission medications   Medication Sig Start Date End Date Taking? Authorizing Provider  FLUoxetine (PROZAC) 20 MG tablet Take 1 tablet (20 mg total) by mouth daily. 05/29/20   Orland Mustard, MD     Allergies    Patient has no known allergies.   Review of Systems   Review of Systems A comprehensive review of systems was completed and negative except as noted in HPI.    Physical Exam BP 114/67   Pulse 93   Temp 98 F (36.7 C) (Oral)   Resp 20   Ht 5\' 9"  (1.753 m)   Wt (!) 154.2 kg   SpO2 99%   BMI 50.21 kg/m   Physical Exam Vitals and nursing note reviewed.  Constitutional:      Appearance: Normal appearance.  HENT:  Head: Normocephalic and atraumatic.     Nose: Nose normal.     Mouth/Throat:     Mouth: Mucous membranes are moist.  Eyes:     Extraocular Movements: Extraocular movements intact.     Conjunctiva/sclera: Conjunctivae normal.  Cardiovascular:     Rate and Rhythm: Normal rate.  Pulmonary:     Effort: Pulmonary effort is normal.     Breath sounds: Normal breath sounds.  Abdominal:     General: Abdomen is flat.     Palpations: Abdomen is soft.     Tenderness: There is no abdominal tenderness. There is no guarding.  Genitourinary:    Comments: Chaperone present: No hemorrhoids on external exam. There is a large anal fissure at 6 o'clock still with some blood oozing. Musculoskeletal:        General: No swelling. Normal range of motion.     Cervical back: Neck supple.  Skin:    General: Skin is warm and dry.  Neurological:     General: No focal  deficit present.     Mental Status: He is alert.  Psychiatric:        Mood and Affect: Mood normal.      ED Results / Procedures / Treatments   Labs (all labs ordered are listed, but only abnormal results are displayed) Labs Reviewed  CBC WITH DIFFERENTIAL/PLATELET - Abnormal; Notable for the following components:      Result Value   Eosinophils Absolute 0.6 (*)    All other components within normal limits    EKG None   Radiology MR Shoulder Left Wo Contrast  Result Date: 05/30/2020 CLINICAL DATA:  Left shoulder pain.  No known injury. EXAM: MRI OF THE LEFT SHOULDER WITHOUT CONTRAST TECHNIQUE: Multiplanar, multisequence MR imaging of the shoulder was performed. No intravenous contrast was administered. COMPARISON:  None. FINDINGS: Rotator cuff: Supraspinatus tendon is intact. Infraspinatus tendon is intact. Teres minor tendon is intact. Subscapularis tendon is intact. Muscles: No muscle atrophy or edema. No intramuscular fluid collection or hematoma. Biceps Long Head: Intraarticular and extraarticular portions of the biceps tendon are intact. Acromioclavicular Joint: Moderate arthropathy of the acromioclavicular joint. Type I acromion. No subacromial/subdeltoid bursal fluid. Glenohumeral Joint: Small joint effusion. Partial-thickness cartilage loss with areas of full-thickness cartilage loss of the glenohumeral joint with marginal osteophytes. Labrum: Grossly intact, but evaluation is limited by lack of intraarticular fluid/contrast. Bones: No fracture or dislocation. No aggressive osseous lesion. Other: No fluid collection or hematoma. IMPRESSION: 1. No rotator cuff tear of the left shoulder. 2. 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 Electronically Signed   By: Elige Ko   On: 05/30/2020 16:14   MR Knee Right Wo Contrast  Result Date: 05/30/2020 CLINICAL DATA:  Untreated meniscal tear with new pain and weakness. No previous relevant surgery. EXAM: MRI OF THE RIGHT KNEE  WITHOUT CONTRAST TECHNIQUE: Multiplanar, multisequence MR imaging of the knee was performed. No intravenous contrast was administered. COMPARISON:  Radiographs 04/28/2020 FINDINGS: MENISCI Medial meniscus:  Intact with normal morphology. Lateral meniscus: Mild diffuse meniscal degeneration without evidence of tear or displaced meniscal fragment. LIGAMENTS Cruciates: Intact. There is ACL mucoid degeneration with intercondylar notch cyst formation. Collaterals:  Intact. CARTILAGE Patellofemoral:  Preserved. Medial: There is a full-thickness chondral defect over the central weight-bearing aspect of the medial femoral condyle which measures up to 1.5 cm AP on sagittal image 9/8. Additional mild chondral thinning and surface irregularity with small peripheral osteophytes. Lateral: Moderate chondral thinning, surface irregularity and osteophyte formation. MISCELLANEOUS  Joint: Moderate-sized joint effusion. In correlation with recent radiographs, there is a central loose body anterior to the PCL which demonstrates central fat signal on the coronal T1 weighted images. Popliteal Fossa:  Unremarkable. No significant Baker's cyst. Extensor Mechanism:  Intact. Bones:  No acute or significant extra-articular osseous findings. Other: Mild prepatellar subcutaneous edema without focal fluid collection. IMPRESSION: 1. Medial and lateral compartment degenerative chondrosis, advanced for age. There is a full-thickness chondral defect over the central weight-bearing aspect of the medial femoral condyle. 2. Moderate-sized joint effusion with central loose body. 3. Both menisci are degenerated without discrete tear or displaced fragment. 4. ACL mucoid degeneration and mild intercondylar notch cyst formation. No acute ligamentous findings. Electronically Signed   By: Carey Bullocks M.D.   On: 05/30/2020 16:22    Procedures Procedures  Medications Ordered in the ED Medications - No data to display   MDM  Rules/Calculators/A&P MDM Patient with BRBPR, not on blood thinners with a large anal fissure on exam that is still bleeding. Will check Hgb to compare to labs done yesterday. If unchanged, anticipate expectant management. ED Course  I have reviewed the triage vital signs and the nursing notes.  Pertinent labs & imaging results that were available during my care of the patient were reviewed by me and considered in my medical decision making (see chart for details).  Clinical Course as of 05/31/20 0019  Sat May 31, 2020  0017 Hgb is unchanged from baseline. Plan discharge home. Given standard care instructions for anal fissure. Advised to follow up with PCP or [facility] if bleeding continues or if he has any other concerns.  [CS]    Clinical Course User Index [CS] Pollyann Savoy, MD    Final Clinical Impression(s) / ED Diagnoses Final diagnoses:  Bright red blood per rectum  Anal fissure    Rx / DC Orders ED Discharge Orders    None       Pollyann Savoy, MD 05/31/20 [phone]

## 2020-05-31 LAB — CBC WITH DIFFERENTIAL/PLATELET
Abs Immature Granulocytes: 0.02 10*3/uL (ref 0.00–0.07)
Basophils Absolute: 0.1 10*3/uL (ref 0.0–0.1)
Basophils Relative: 1 %
Eosinophils Absolute: 0.6 10*3/uL — ABNORMAL HIGH (ref 0.0–0.5)
Eosinophils Relative: 7 %
HCT: 45.5 % (ref 39.0–52.0)
Hemoglobin: 15.5 g/dL (ref 13.0–17.0)
Immature Granulocytes: 0 %
Lymphocytes Relative: 22 %
Lymphs Abs: 2 10*3/uL (ref 0.7–4.0)
MCH: 30.8 pg (ref 26.0–34.0)
MCHC: 34.1 g/dL (ref 30.0–36.0)
MCV: 90.3 fL (ref 80.0–100.0)
Monocytes Absolute: 0.6 10*3/uL (ref 0.1–1.0)
Monocytes Relative: 7 %
Neutro Abs: 5.6 10*3/uL (ref 1.7–7.7)
Neutrophils Relative %: 63 %
Platelets: 192 10*3/uL (ref 150–400)
RBC: 5.04 MIL/uL (ref 4.22–5.81)
RDW: 13 % (ref 11.5–15.5)
WBC: 8.9 10*3/uL (ref 4.0–10.5)
nRBC: 0 % (ref 0.0–0.2)

## 2020-06-02 LAB — URINE CYTOLOGY ANCILLARY ONLY
Bacterial Vaginitis-Urine: NEGATIVE
Candida Urine: NEGATIVE
Chlamydia: NEGATIVE
Comment: NEGATIVE
Comment: NORMAL
Neisseria Gonorrhea: NEGATIVE

## 2020-06-04 ENCOUNTER — Other Ambulatory Visit: Payer: Self-pay | Admitting: Family Medicine

## 2020-06-04 DIAGNOSIS — G8929 Other chronic pain: Secondary | ICD-10-CM

## 2020-06-04 DIAGNOSIS — M19012 Primary osteoarthritis, left shoulder: Secondary | ICD-10-CM

## 2020-06-05 ENCOUNTER — Telehealth: Payer: Self-pay

## 2020-06-05 NOTE — Telephone Encounter (Signed)
Please see lab note.

## 2020-06-05 NOTE — Telephone Encounter (Signed)
Patient is returning a call about lab results.  

## 2020-06-25 ENCOUNTER — Ambulatory Visit: Payer: Commercial Managed Care - PPO | Admitting: Family Medicine

## 2020-06-25 ENCOUNTER — Other Ambulatory Visit (HOSPITAL_COMMUNITY): Payer: Commercial Managed Care - PPO

## 2020-06-26 ENCOUNTER — Other Ambulatory Visit: Payer: Self-pay

## 2020-06-26 ENCOUNTER — Ambulatory Visit (HOSPITAL_COMMUNITY): Admission: RE | Admit: 2020-06-26 | Payer: Commercial Managed Care - PPO | Source: Ambulatory Visit

## 2020-06-26 ENCOUNTER — Encounter: Payer: Self-pay | Admitting: Family Medicine

## 2020-06-26 ENCOUNTER — Ambulatory Visit (INDEPENDENT_AMBULATORY_CARE_PROVIDER_SITE_OTHER): Payer: Commercial Managed Care - PPO | Admitting: Family Medicine

## 2020-06-26 DIAGNOSIS — F411 Generalized anxiety disorder: Secondary | ICD-10-CM | POA: Insufficient documentation

## 2020-06-26 MED ORDER — SILDENAFIL CITRATE 50 MG PO TABS: 50.0000 mg | ORAL_TABLET | Freq: Every day | ORAL | 0 refills | Status: DC | PRN

## 2020-06-26 NOTE — Progress Notes (Signed)
Patient: Bradley Barr MRN: [id number] DOB: 04-15-1980 PCP: Bradley Mustard, MD     Subjective:  Chief Complaint  Patient presents with  . Anxiety    HPI: The patient is a 39 y.o. male who presents today for Anxiety. Pt says has noticed that the medication is helping. He is happy on current dosage.  He is having no panic attacks. He has not gotten worked up even when his truck broke down. Even a friend states he never gets mad anymore. He is having some sexual side effects. NO decreased libido, but having issues with erection which he had before medication it is just worse now.  He has been using a natural medication for erection and it helps. It causes him to have headaches and he doesn't want to take this and doesn't want to go off prozac since working so well.   Has appointment with urology next month.   Review of Systems  Constitutional: Negative for chills, fatigue and fever.  HENT: Negative for dental problem, ear pain, hearing loss and trouble swallowing.   Eyes: Negative for visual disturbance.  Respiratory: Negative for cough, chest tightness and shortness of breath.   Cardiovascular: Negative for chest pain, palpitations and leg swelling.  Gastrointestinal: Negative for abdominal pain, blood in stool, diarrhea and nausea.  Endocrine: Negative for cold intolerance, polydipsia, polyphagia and polyuria.  Genitourinary: Negative for dysuria and hematuria.  Musculoskeletal: Negative for arthralgias.  Skin: Negative for rash.  Neurological: Negative for dizziness and headaches.  Psychiatric/Behavioral: Negative for dysphoric mood and sleep disturbance. The patient is not nervous/anxious.     Allergies Patient has No Known Allergies.  Past Medical History Patient  has a past medical history of Allergy, Anxiety, Depression, GERD (gastroesophageal reflux disease), Kidney stones, Pancreatitis, and Pancreatitis (2016).  Surgical History Patient  has a past surgical  history that includes Appendectomy and Ankle surgery (2021).  Family History Pateint's family history includes ADD / ADHD in his maternal grandfather; Alcohol abuse in his maternal grandfather; Arthritis in his maternal grandmother and mother; Asthma in his maternal grandmother and mother; COPD in his maternal grandmother and mother; Cancer in his maternal grandfather and maternal grandmother; Depression in his mother; Diabetes in his maternal grandfather; Early death in his paternal grandfather; Hearing loss in his maternal grandfather and maternal grandmother; Heart disease in his maternal grandfather and maternal grandmother; Hyperlipidemia in his maternal grandfather and maternal grandmother; Stroke in his maternal grandmother, paternal grandfather, and paternal grandmother.  Social History Patient  reports that he has quit smoking. His smoking use included cigarettes. He smoked 2.00 packs per day. He has never used smokeless tobacco. He reports current alcohol use. He reports current drug use. Drug: Marijuana.    Objective: Vitals:   06/26/20 1423  BP: 120/62  Pulse: 88  Temp: 98.1 F (36.7 C)  TempSrc: Temporal  SpO2: 97%  Weight: (!) 340 lb 3.2 oz (154.3 kg)  Height: 5\' 9"  (1.753 m)    Body mass index is 50.24 kg/m.  Physical Exam Vitals reviewed.  Constitutional:      Appearance: Normal appearance. He is obese.  HENT:     Head: Normocephalic and atraumatic.  Pulmonary:     Effort: Pulmonary effort is normal.  Skin:    General: Skin is warm.     Capillary Refill: Capillary refill takes less than 2 seconds.  Neurological:     General: No focal deficit present.     Mental Status: He is alert and oriented to person,  place, and time.  Psychiatric:        Mood and Affect: Mood normal.        Behavior: Behavior normal.         GAD 7 : Generalized Anxiety Score 06/26/2020 04/28/2020  Nervous, Anxious, on Edge 2 2  Control/stop worrying 2 3  Worry too much - different  things 2 3  Trouble relaxing 2 3  Restless 2 2  Easily annoyed or irritable 2 3  Afraid - awful might happen 2 2  Total GAD 7 Score 14 18  Anxiety Difficulty Very difficult Not difficult at all      Assessment/plan: 1. GAD (generalized anxiety disorder) GAD7 with significant improvement and clinically even more so. He is very happy and others around him can tell the change. He has no desire to increase dose as he thinks he is doing well. He is having adverse sexual side effects from the medication, but doesn't want to go off of this since his anxiety and anger are so well controlled. I will give him a short supply of viagra to try and he already has f/u with urology in a month so discussed I would want them to manage and take over and rule out other causes of his sexual dysfunction since it was already an issue. They can take over refills since I am leaving. Precautions and side effects of viagra discussed. If priapism he is to go to ER.  -urology in one month and  Routine f/u with Dr. Jimmey Ralph in 6 months.    Return in about 6 months (around 12/27/2020) for anxiety dr. Jimmey Ralph. (toc) .   Bradley Mustard, MD [place]   06/26/2020

## 2020-06-26 NOTE — Patient Instructions (Addendum)
-  im glad you are doing well. Since you don't want to change medication I would f/u with urology with erectile dysfunction, but prozac is not helping.   -viagra sent in. Start with 1/2 tab 30 minutes before sex. Would f/u with urology on this. May need to change prozac in the future.    -I won't be here so I would f/u with dr. Jimmey Ralph.

## 2020-07-02 ENCOUNTER — Encounter: Payer: Self-pay | Admitting: Family Medicine

## 2020-08-06 ENCOUNTER — Telehealth: Payer: Self-pay

## 2020-08-06 NOTE — Telephone Encounter (Signed)
Dr. Artis Flock,  Have spoke with Bradley Barr several time to schedule the GXT that you ordered.   Today he stated he was unable to do because he has been to busy to schedule the test.   I explain to his when is is ready to schedule, please let your office know so a new order order will be placed.    [facility] Scheduling Team

## 2020-09-02 ENCOUNTER — Encounter: Payer: Self-pay | Admitting: Family Medicine

## 2020-09-03 ENCOUNTER — Other Ambulatory Visit: Payer: Self-pay

## 2020-09-03 DIAGNOSIS — K625 Hemorrhage of anus and rectum: Secondary | ICD-10-CM

## 2020-09-15 ENCOUNTER — Other Ambulatory Visit: Payer: Self-pay

## 2020-09-15 ENCOUNTER — Telehealth (INDEPENDENT_AMBULATORY_CARE_PROVIDER_SITE_OTHER): Payer: Commercial Managed Care - PPO | Admitting: Physician Assistant

## 2020-09-15 ENCOUNTER — Encounter: Payer: Self-pay | Admitting: Physician Assistant

## 2020-09-15 VITALS — Temp 101.1°F | Ht 69.0 in

## 2020-09-15 DIAGNOSIS — U071 COVID-19: Secondary | ICD-10-CM

## 2020-09-15 NOTE — Progress Notes (Signed)
Virtual Visit via Video Note  I connected with Bradley Barr on 09/15/20 at  4:00 PM EDT by a video enabled telemedicine application and verified that I am speaking with the correct person using two identifiers.  Location: Patient: home Provider: Nature conservation officer at [employer] Persons present: Patient and myself   I discussed the limitations of evaluation and management by telemedicine and the availability of in person appointments. The patient expressed understanding and agreed to proceed.   History of Present Illness: Chief complaint: COVID-19 positive Symptom onset: 09/12/20 Pertinent positives: Congested in chest, runny nose, fatigue, sharp pains in right side of his back, SOB, tightness in chest, fever Pertinent negatives: Chest pain, N/V  Treatments tried: Mucinex, alka-seltzer cold and flu tabs, humidifier Vaccine status: Pfizer x 2, no boosters Sick exposure: Girlfriend positive for COVID on 09-10-20    Observations/Objective: Temp: 101.1 F this morning per patient   Gen: Awake, alert, no acute distress Resp: Breathing is even and non-labored Psych: calm/pleasant demeanor Neuro: Alert and Oriented x 3, + facial symmetry, speech is clear.   Assessment and Plan: 1. COVID-19 COVID-19 positive confirmed via home antigen test, now with right sided chest and back pains. Advised patient to go to ED setting for urgent evaluation.    Follow Up Instructions:    I discussed the assessment and treatment plan with the patient. The patient was provided an opportunity to ask questions and all were answered. The patient agreed with the plan and demonstrated an understanding of the instructions.   The patient was advised to call back or seek an in-person evaluation if the symptoms worsen or if the condition fails to improve as anticipated.  Aneliese Beaudry M Alexandr Oehler, PA-C

## 2020-09-16 ENCOUNTER — Encounter: Payer: Self-pay | Admitting: Physician Assistant

## 2020-09-16 ENCOUNTER — Telehealth: Payer: Self-pay

## 2020-09-16 NOTE — Telephone Encounter (Signed)
Left voice message for patient to call clinic.  

## 2020-09-16 NOTE — Telephone Encounter (Signed)
Patient states he was seen yesterday 09/15/20 virtually with [facility].   States a letter was supposed to have been sent to him through his mychart excusing him from work the remainder of the week.    Patient has not received yet.   Please follow up in regard.

## 2020-09-17 ENCOUNTER — Other Ambulatory Visit: Payer: Self-pay | Admitting: Physician Assistant

## 2020-09-17 MED ORDER — AMOXICILLIN-POT CLAVULANATE 875-125 MG PO TABS: 1.0000 | ORAL_TABLET | Freq: Two times a day (BID) | ORAL | 0 refills | Status: AC

## 2020-09-17 NOTE — Telephone Encounter (Signed)
I talked with the patient personally today.  He says that overall he is starting to feel better from COVID.  He is very concerned that he has a right sided pneumonia, as it feels exactly as it did when he had walking pneumonia with her previous sickness.  He says he does not have insurance and financially cannot afford to get this checked out at the urgent care or emergency department.  He cannot afford to do a chest x-ray right now.  I agreed to send Augmentin for the patient as he is very concerned about bacterial pneumonia.  Risks and benefits were discussed as well as possible side effects.  Low threshold for seeking in person treatment if he were to get worse or not improve.

## 2020-12-22 ENCOUNTER — Encounter (HOSPITAL_BASED_OUTPATIENT_CLINIC_OR_DEPARTMENT_OTHER): Payer: Self-pay | Admitting: Emergency Medicine

## 2020-12-22 ENCOUNTER — Emergency Department (HOSPITAL_BASED_OUTPATIENT_CLINIC_OR_DEPARTMENT_OTHER)
Admission: EM | Admit: 2020-12-22 | Discharge: 2020-12-23 | Disposition: A | Discharge status: Home / Self Care | Payer: Commercial Managed Care - PPO | Attending: Emergency Medicine | Admitting: Emergency Medicine

## 2020-12-22 ENCOUNTER — Emergency Department (HOSPITAL_BASED_OUTPATIENT_CLINIC_OR_DEPARTMENT_OTHER): Payer: Commercial Managed Care - PPO

## 2020-12-22 ENCOUNTER — Other Ambulatory Visit: Payer: Self-pay

## 2020-12-22 DIAGNOSIS — Y92002 Bathroom of unspecified non-institutional (private) residence single-family (private) house as the place of occurrence of the external cause: Secondary | ICD-10-CM | POA: Diagnosis not present

## 2020-12-22 DIAGNOSIS — W1811XA Fall from or off toilet without subsequent striking against object, initial encounter: Secondary | ICD-10-CM | POA: Insufficient documentation

## 2020-12-22 DIAGNOSIS — F17210 Nicotine dependence, cigarettes, uncomplicated: Secondary | ICD-10-CM | POA: Diagnosis not present

## 2020-12-22 DIAGNOSIS — S99922A Unspecified injury of left foot, initial encounter: Secondary | ICD-10-CM | POA: Insufficient documentation

## 2020-12-22 DIAGNOSIS — M79672 Pain in left foot: Secondary | ICD-10-CM

## 2020-12-22 MED ORDER — KETOROLAC TROMETHAMINE 60 MG/2ML IM SOLN
60.0000 mg | Freq: Once | INTRAMUSCULAR | Status: AC
  Administered 2020-12-23: 60 mg via INTRAMUSCULAR
  Filled 2020-12-22: qty 2

## 2020-12-22 NOTE — ED Triage Notes (Signed)
Pt via pov from home with left foot pain. He recently had achilles surgery and bone spur removal (2 weeks ago). Tonight, he was standing up off the toilet and fell hard on the foot. Pt states he has follow up with surgeon on Wednesday but states that the pain is intense and he is concerned that he may have loosened or broken his stitches. Pt alert & oriented, nad noted.

## 2020-12-23 ENCOUNTER — Encounter (HOSPITAL_BASED_OUTPATIENT_CLINIC_OR_DEPARTMENT_OTHER): Payer: Self-pay | Admitting: Emergency Medicine

## 2020-12-23 MED ORDER — DICLOFENAC SODIUM ER 100 MG PO TB24: 100.0000 mg | ORAL_TABLET | Freq: Every day | ORAL | 0 refills | Status: DC

## 2020-12-23 NOTE — ED Provider Notes (Signed)
[facility] EMERGENCY DEPT Provider Note   CSN: [id number] Arrival date & time: 12/22/20  2133     History Chief Complaint  Patient presents with   Foot Pain    Bradley Barr is a 40 y.o. male.  The history is provided by the patient.  Foot Pain This is a recurrent problem. The current episode started 3 to 5 hours ago. The problem occurs constantly. The problem has not changed since onset.Pertinent negatives include no chest pain, no abdominal pain, no headaches and no shortness of breath. Nothing aggravates the symptoms. Nothing relieves the symptoms. He has tried nothing for the symptoms. The treatment provided no relief.      Past Medical History:  Diagnosis Date   Allergy    Anxiety    Depression    GERD (gastroesophageal reflux disease)    Kidney stones    Pancreatitis    Pancreatitis 2016    Patient Active Problem List   Diagnosis Date Noted   GAD (generalized anxiety disorder) 06/26/2020   Chronic pain of right knee 08/11/2017   Depression, major, recurrent, moderate (HCC) 12/18/2014   Panic attack 12/18/2014   Fatty liver disease, nonalcoholic 05/03/2014    Past Surgical History:  Procedure Laterality Date   ANKLE SURGERY  2021   APPENDECTOMY         Family History  Problem Relation Age of Onset   Arthritis Mother    Asthma Mother    Depression Mother    COPD Mother    Arthritis Maternal Grandmother    Asthma Maternal Grandmother    COPD Maternal Grandmother    Heart disease Maternal Grandmother    Hearing loss Maternal Grandmother    Hyperlipidemia Maternal Grandmother    Stroke Maternal Grandmother    Cancer Maternal Grandmother    Hearing loss Maternal Grandfather    Heart disease Maternal Grandfather    Hyperlipidemia Maternal Grandfather    Diabetes Maternal Grandfather    ADD / ADHD Maternal Grandfather    Alcohol abuse Maternal Grandfather    Cancer Maternal Grandfather    Stroke Paternal Grandmother     Stroke Paternal Grandfather    Early death Paternal Grandfather     Social History   Tobacco Use   Smoking status: Some Days    Packs/day: 0.10    Types: Cigarettes   Smokeless tobacco: Never  Vaping Use   Vaping Use: Never used  Substance Use Topics   Alcohol use: Yes    Comment: occasional   Drug use: Yes    Types: Marijuana    Comment: used in the past     Home Medications Prior to Admission medications   Medication Sig Start Date End Date Taking? Authorizing Provider  Diclofenac Sodium CR 100 MG 24 hr tablet Take 1 tablet (100 mg total) by mouth daily. 12/23/20  Yes Darely Becknell, MD  FLUoxetine (PROZAC) 20 MG tablet Take 1 tablet (20 mg total) by mouth daily. Patient not taking: Reported on 09/15/2020 05/29/20   Orland Mustard, MD  sildenafil (VIAGRA) 50 MG tablet Take 1 tablet (50 mg total) by mouth daily as needed for erectile dysfunction. Patient not taking: Reported on 09/15/2020 06/26/20   Orland Mustard, MD    Allergies    Patient has no known allergies.  Review of Systems   Review of Systems  Constitutional:  Negative for fever.  HENT:  Negative for congestion.   Eyes:  Negative for redness.  Respiratory:  Negative for shortness of breath.  Cardiovascular:  Negative for chest pain.  Gastrointestinal:  Negative for abdominal pain.  Genitourinary:  Negative for difficulty urinating.  Musculoskeletal:  Negative for neck stiffness.  Skin:  Negative for rash.  Neurological:  Negative for headaches.  Psychiatric/Behavioral:  Negative for agitation.   All other systems reviewed and are negative.  Physical Exam Updated Vital Signs BP 125/73 (BP Location: Right Arm)   Pulse 95   Temp 97.8 F (36.6 C) (Oral)   Resp 20   Ht 5\' 9"  (1.753 m)   Wt (!) 142.9 kg   SpO2 95%   BMI 46.52 kg/m   Physical Exam Vitals and nursing note reviewed.  Constitutional:      General: He is not in acute distress.    Appearance: Normal appearance.  HENT:     Head:  Normocephalic and atraumatic.     Nose: Nose normal.  Eyes:     Conjunctiva/sclera: Conjunctivae normal.     Pupils: Pupils are equal, round, and reactive to light.  Cardiovascular:     Rate and Rhythm: Normal rate and regular rhythm.     Pulses: Normal pulses.     Heart sounds: Normal heart sounds.  Pulmonary:     Effort: Pulmonary effort is normal.     Breath sounds: Normal breath sounds.  Abdominal:     General: Abdomen is flat. Bowel sounds are normal.     Palpations: Abdomen is soft.     Tenderness: There is no abdominal tenderness. There is no guarding.  Musculoskeletal:        General: Normal range of motion.     Cervical back: Normal range of motion and neck supple.     Right ankle: Normal.     Right Achilles Tendon: Normal.     Right foot: Normal. Normal capillary refill. Normal pulse.     Comments: Incision CDI, no redness, no bleeding.  FROM of the R foot, NVI and 3+ pulse   Skin:    General: Skin is warm and dry.     Capillary Refill: Capillary refill takes less than 2 seconds.  Neurological:     General: No focal deficit present.     Mental Status: He is alert and oriented to person, place, and time.     Deep Tendon Reflexes: Reflexes normal.  Psychiatric:        Mood and Affect: Mood normal.        Behavior: Behavior normal.    ED Results / Procedures / Treatments   Labs (all labs ordered are listed, but only abnormal results are displayed) Labs Reviewed - No data to display  EKG None  Radiology DG Ankle Complete Left  Result Date: 12/22/2020 CLINICAL DATA:  Pain. Recent Achilles surgery with bone spur removal. EXAM: LEFT FOOT - COMPLETE 3+ VIEW; LEFT ANKLE COMPLETE - 3+ VIEW COMPARISON:  None. FINDINGS: Right ankle: Overlying cast is present. There is no evidence of fracture or dislocation. There is no evidence of arthropathy or other focal bone abnormality. Soft tissues are unremarkable. Right foot: Overlying cast is present. There are postsurgical  changes along the posterior aspect of the calcaneus which appear within normal limits. There is no acute fracture or dislocation. Joint spaces are maintained. There are minimal degenerative changes at the first metatarsophalangeal joint with osteophyte formation. Soft tissues are grossly within normal limits. IMPRESSION: 1. Postsurgical changes of the calcaneus. 2. No acute fracture or dislocation of the right foot or right ankle. Electronically Signed   By: 12/24/2020  Malena Edman M.D.   On: 12/22/2020 23:40   DG Foot Complete Left  Result Date: 12/22/2020 CLINICAL DATA:  Pain. Recent Achilles surgery with bone spur removal. EXAM: LEFT FOOT - COMPLETE 3+ VIEW; LEFT ANKLE COMPLETE - 3+ VIEW COMPARISON:  None. FINDINGS: Right ankle: Overlying cast is present. There is no evidence of fracture or dislocation. There is no evidence of arthropathy or other focal bone abnormality. Soft tissues are unremarkable. Right foot: Overlying cast is present. There are postsurgical changes along the posterior aspect of the calcaneus which appear within normal limits. There is no acute fracture or dislocation. Joint spaces are maintained. There are minimal degenerative changes at the first metatarsophalangeal joint with osteophyte formation. Soft tissues are grossly within normal limits. IMPRESSION: 1. Postsurgical changes of the calcaneus. 2. No acute fracture or dislocation of the right foot or right ankle. Electronically Signed   By: Darliss Cheney M.D.   On: 12/22/2020 23:40    Procedures Procedures   Medications Ordered in ED Medications  ketorolac (TORADOL) injection 60 mg (60 mg Intramuscular Given 12/23/20 0000)    ED Course  I have reviewed the triage vital signs and the nursing notes.  Pertinent labs & imaging results that were available during my care of the patient were reviewed by me and considered in my medical decision making (see chart for details).  Splint replaced and is normal with 3+ pulses pre and post.   Ice and elevation and NSAIDS   Bradley Barr was evaluated in Emergency Department on 12/23/2020 for the symptoms described in the history of present illness. He was evaluated in the context of the global COVID-19 pandemic, which necessitated consideration that the patient might be at risk for infection with the SARS-CoV-2 virus that causes COVID-19. Institutional protocols and algorithms that pertain to the evaluation of patients at risk for COVID-19 are in a state of rapid change based on information released by regulatory bodies including the CDC and federal and state organizations. These policies and algorithms were followed during the patient's care in the ED.  Final Clinical Impression(s) / ED Diagnoses Final diagnoses:  Left foot pain   Return for intractable cough, coughing up blood, fevers > 100.4 unrelieved by medication, shortness of breath, intractable vomiting, chest pain, shortness of breath, weakness, numbness, changes in speech, facial asymmetry, abdominal pain, passing out, Inability to tolerate liquids or food, cough, altered mental status or any concerns. No signs of systemic illness or infection. The patient is nontoxic-appearing on exam and vital signs are within normal limits.  I have reviewed the triage vital signs and the nursing notes. Pertinent labs & imaging results that were available during my care of the patient were reviewed by me and considered in my medical decision making (see chart for details). After history, exam, and medical workup I feel the patient has been appropriately medically screened and is safe for discharge home. Pertinent diagnoses were discussed with the patient. Patient was given return precautions.  Rx / DC Orders ED Discharge Orders          Ordered    Diclofenac Sodium CR 100 MG 24 hr tablet  Daily        12/23/20 0054             Kalia Vahey, MD 12/23/20 5638

## 2020-12-30 ENCOUNTER — Encounter: Payer: Self-pay | Admitting: Family Medicine

## 2020-12-30 ENCOUNTER — Encounter: Payer: Commercial Managed Care - PPO | Admitting: Family Medicine

## 2020-12-30 NOTE — Progress Notes (Incomplete)
   Bradley Barr is a 40 y.o. male who presents today for an office visit.  Assessment/Plan:  New/Acute Problems: ***  Chronic Problems Addressed Today: No problem-specific Assessment & Plan notes found for this encounter.     Subjective:  HPI:  She is here to establish care.  Previous PCP no longer works at this office.   He is currently on Prozac 20 mg daily for anxiety.        Objective:  Physical Exam: There were no vitals taken for this visit.  Gen: No acute distress, resting comfortably*** CV: Regular rate and rhythm with no murmurs appreciated Pulm: Normal work of breathing, clear to auscultation bilaterally with no crackles, wheezes, or rhonchi Neuro: Grossly normal, moves all extremities Psych: Normal affect and thought content       I,Savera Zaman,acting as a scribe for Jacquiline Doe, MD.,have documented all relevant documentation on the behalf of Jacquiline Doe, MD,as directed by  Jacquiline Doe, MD while in the presence of Jacquiline Doe, MD.   *** Katina Degree. Jimmey Ralph, MD 12/30/2020 7:51 AM

## 2021-07-01 ENCOUNTER — Encounter: Payer: Self-pay | Admitting: Nurse Practitioner

## 2021-07-01 ENCOUNTER — Ambulatory Visit: Payer: Self-pay | Admitting: Nurse Practitioner

## 2021-07-01 DIAGNOSIS — Z113 Encounter for screening for infections with a predominantly sexual mode of transmission: Secondary | ICD-10-CM

## 2021-07-01 DIAGNOSIS — Z202 Contact with and (suspected) exposure to infections with a predominantly sexual mode of transmission: Secondary | ICD-10-CM

## 2021-07-01 LAB — HEPATITIS B SURFACE ANTIGEN

## 2021-07-01 LAB — HM HIV SCREENING LAB: HM HIV Screening: NEGATIVE

## 2021-07-01 LAB — HM HEPATITIS C SCREENING LAB: HM Hepatitis Screen: NEGATIVE

## 2021-07-01 LAB — GRAM STAIN

## 2021-07-01 MED ORDER — PENICILLIN G BENZATHINE 1200000 UNIT/2ML IM SUSY
2.4000 10*6.[IU] | PREFILLED_SYRINGE | Freq: Once | INTRAMUSCULAR | Status: AC
  Administered 2021-07-01: 2.4 10*6.[IU] via INTRAMUSCULAR

## 2021-07-01 NOTE — Progress Notes (Signed)
Patient seen for STD screening and syphilis exposure per patient. Gram Stain reviewed , no tx per standing orders. Condoms declined.  ?

## 2021-07-01 NOTE — Progress Notes (Signed)
[facility] Department ?STI clinic/screening visit ? ?Subjective:  ?Bradley Barr is a 41 y.o. male being seen today for an STI screening visit. The patient reports they do have symptoms.   ? ?Patient has the following medical conditions:   ?Patient Active Problem List  ? Diagnosis Date Noted  ? GAD (generalized anxiety disorder) 06/26/2020  ? Chronic pain of right knee 08/11/2017  ? Depression, major, recurrent, moderate ([place]) 12/18/2014  ? Panic attack 12/18/2014  ? Fatty liver disease, nonalcoholic [id number]  ? ? ? ?Chief Complaint  ?Patient presents with  ? SEXUALLY TRANSMITTED DISEASE  ?  Screening  ? ? ?HPI ? ?Patient reports to clinic today for STD screening.  Patient states he is a contact to Syphilis.  Patient reports a rash and genital itching that started one week ago.   ? ?Does the patient or their partner desires a pregnancy in the next year? No ? ?Screening for MPX risk: ?Does the patient have an unexplained rash? Yes ?Is the patient MSM? No ?Does the patient endorse multiple sex partners or anonymous sex partners? No ?Did the patient have close or sexual contact with a person diagnosed with MPX? No ?Has the patient traveled outside the [place] where MPX is endemic? No ?Is there a high clinical suspicion for MPX-- evidenced by one of the following No ? -Unlikely to be chickenpox ? -Lymphadenopathy ? -Rash that present in same phase of evolution on any given body part ? ? ?See flowsheet for further details and programmatic requirements.  ? ?Immunization History  ?Administered Date(s) Administered  ? PFIZER(Purple Top)SARS-COV-2 Vaccination 11/01/2019, 11/22/2019  ? Tdap 10/27/2016  ?  ? ?The following portions of the patient's history were reviewed and updated as appropriate: allergies, current medications, past medical history, past social history, past surgical history and problem list. ? ?Objective:  ?There were no vitals filed for this visit. ? ?Physical Exam ?Constitutional:   ?    Appearance: Normal appearance.  ?HENT:  ?   Head: Normocephalic.  ?   Right Ear: External ear normal.  ?   Left Ear: External ear normal.  ?   Nose: Nose normal.  ?   Mouth/Throat:  ?   Lips: Pink.  ?   Mouth: Mucous membranes are moist.  ?   Comments: No visible signs of dental caries.  Small tiny bumps/papules noted to upper inner lip and corner of lips.  ?Eyes:  ?   Comments: Small cluster rash noted to upper eye lids in under right eye lid.   ?Pulmonary:  ?   Effort: Pulmonary effort is normal.  ?Abdominal:  ?   General: Abdomen is flat.  ?   Palpations: Abdomen is soft.  ?Genitourinary: ?   Penis: Circumcised.   ?   Comments: Pubic area without nits, lice, hair loss, edema, erythema, lesions and inguinal adenopathy. ?Penis without rash, lesions and discharge at meatus. ?Testicles descended bilaterally,nt, no masses or edema.  ?Genital irritation noted around the scrotum and corona of the penis.   ?Musculoskeletal:  ?   Cervical back: Full passive range of motion without pain, normal range of motion and neck supple.  ?Skin: ?   General: Skin is warm and dry.  ?   Comments: Small cluster rash noted to right wrist and crease of forearm   ?Neurological:  ?   Mental Status: He is alert and oriented to person, place, and time.  ?Psychiatric:     ?   Attention and Perception: Attention normal.     ?  Mood and Affect: Mood normal.     ?   Speech: Speech normal.     ?   Behavior: Behavior normal. Behavior is cooperative.  ? ? ? ? ?Assessment and Plan:  ?Bradley Barr is a 41 y.o. male presenting to the [facility] Department for STI screening ? ?1. Screening examination for venereal disease ?-41 year old male in clinic today for STD screening. ?-Patient does have STI symptoms ?Patient accepted all screenings including oral GC and bloodwork for HIV/RPR.  ?Patient meets criteria for HepB screening? Yes. Ordered? Yes ?Patient meets criteria for HepC screening? Yes. Ordered? Yes ?Recommended condom use  with all sex ?Discussed importance of condom use for STI prevent ? ?Treat gram stain per standing order ?Discussed time line for State [facility] results and that patient will be called with positive results and encouraged patient to call if he had not heard in 2 weeks ?Recommended returning for continued or worsening symptoms.   ? ?- HIV/HCV [place] [facility] ?- Syphilis Serology, [place] [facility] ?- HBV Antigen/Antibody [facility] ?- Gram stain ?- Gonococcus culture ?- Gonococcus culture ? ?2. Exposure to syphilis ?-Patient reports an exposure to syphilis.  Please treat patient today as a contact Syphilis with Bicillin 2.4 million units IM x 1 dose.  Will await test results.  Advised patient not to have sex for 14 days and 14 days after partner is treated.   ? ?- penicillin g benzathine (BICILLIN LA) 1200000 UNIT/2ML injection 2.4 Million Units  ? ? ? ? ?Return if symptoms worsen or fail to improve. ? ?Gregary Cromer, FNP ?

## 2021-07-05 LAB — GONOCOCCUS CULTURE

## 2022-08-25 IMAGING — DX DG FOOT COMPLETE 3+V*L*
2 series · 3 of 3 positions shown · non-contrast
Comparison: None.

CLINICAL DATA: Pain. Recent Achilles surgery with bone spur
removal.

EXAM:
LEFT FOOT - COMPLETE 3+ VIEW; LEFT ANKLE COMPLETE - 3+ VIEW

[Series 1: foot · 0.14mm/px · 2 of 2 slices shown]
[im 1/2]
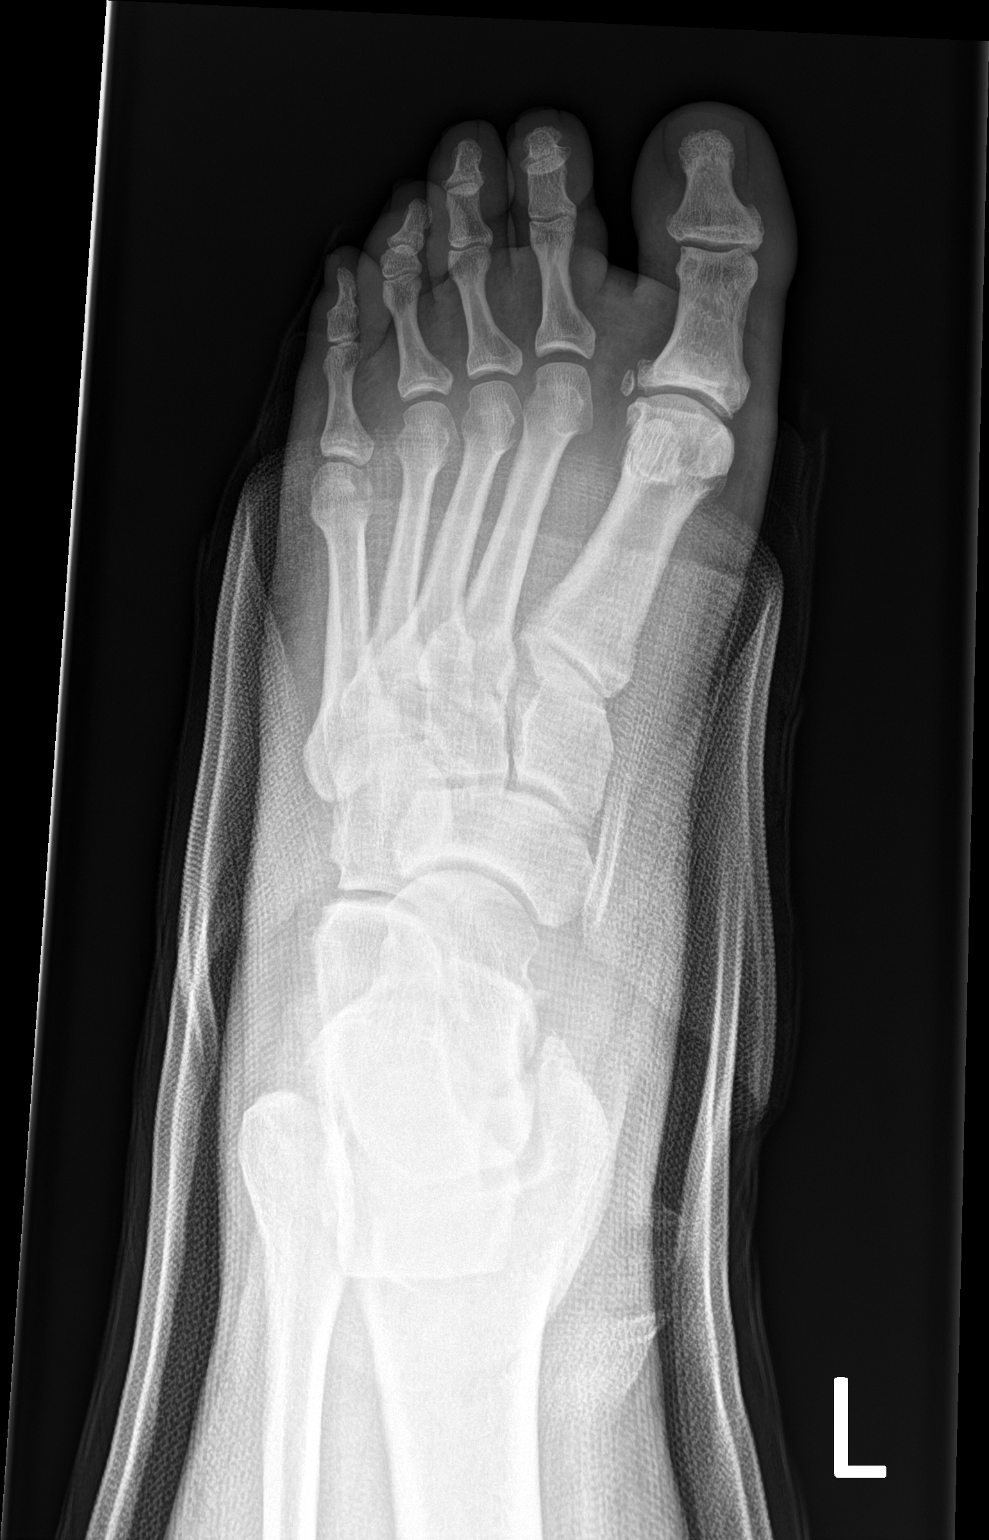
[im 2/2]
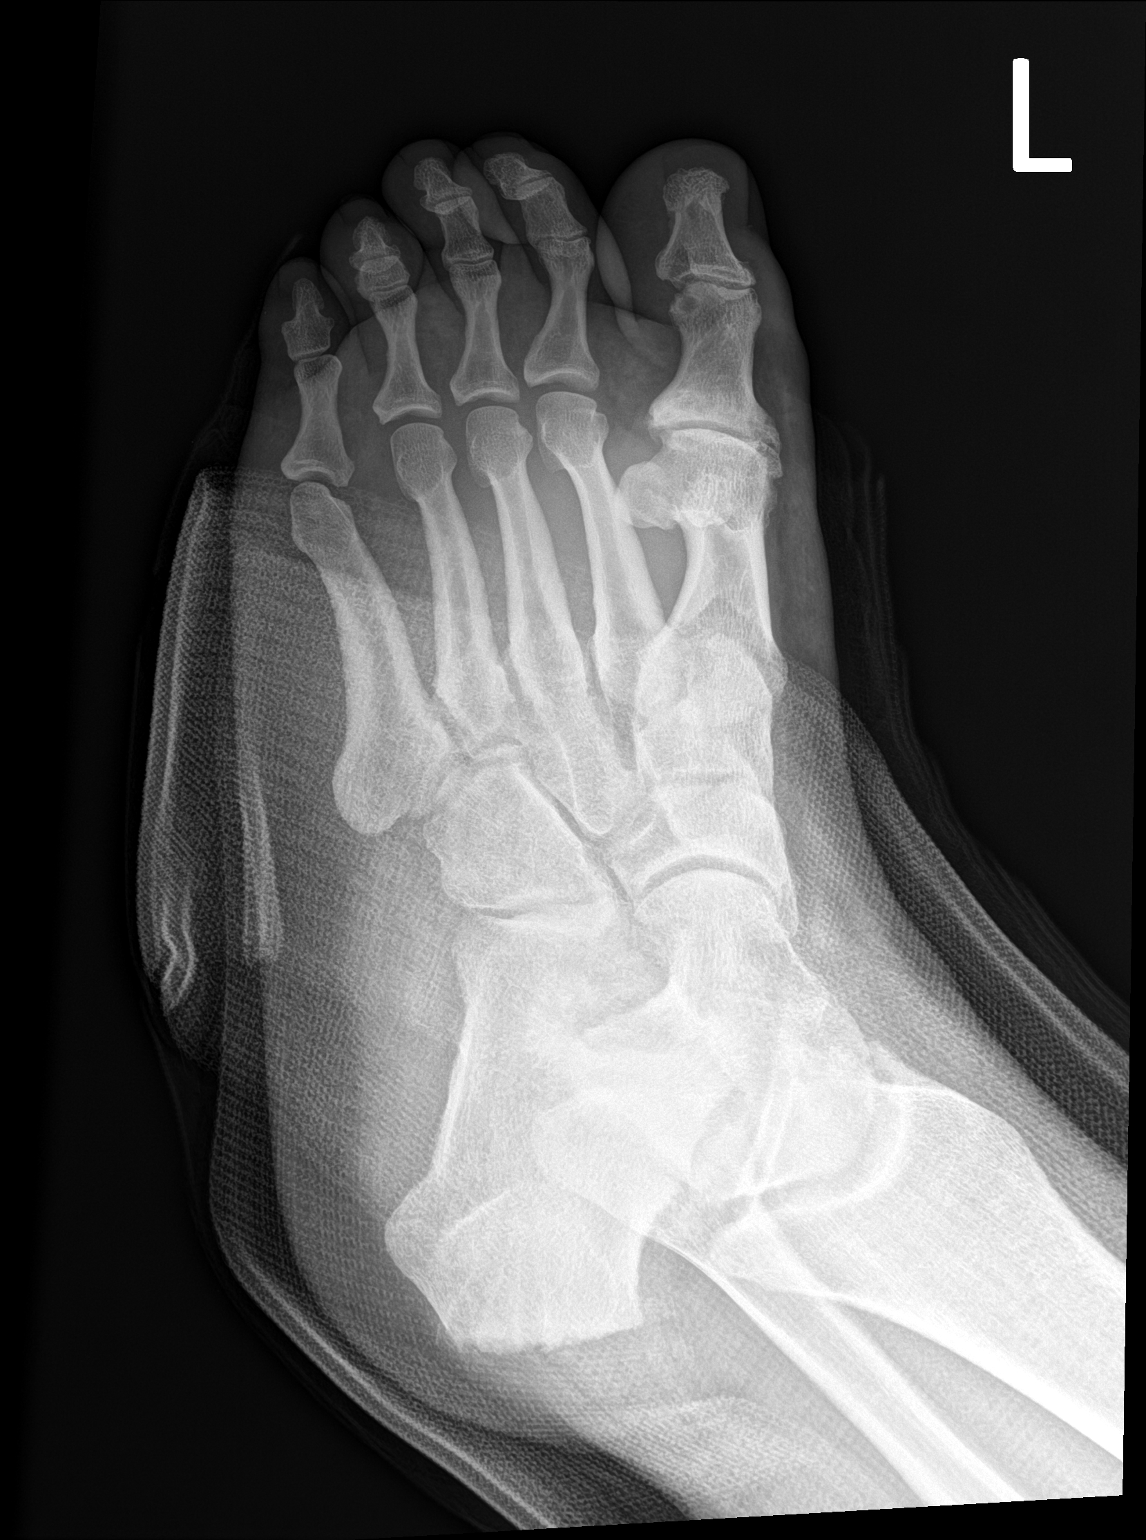

[leg]
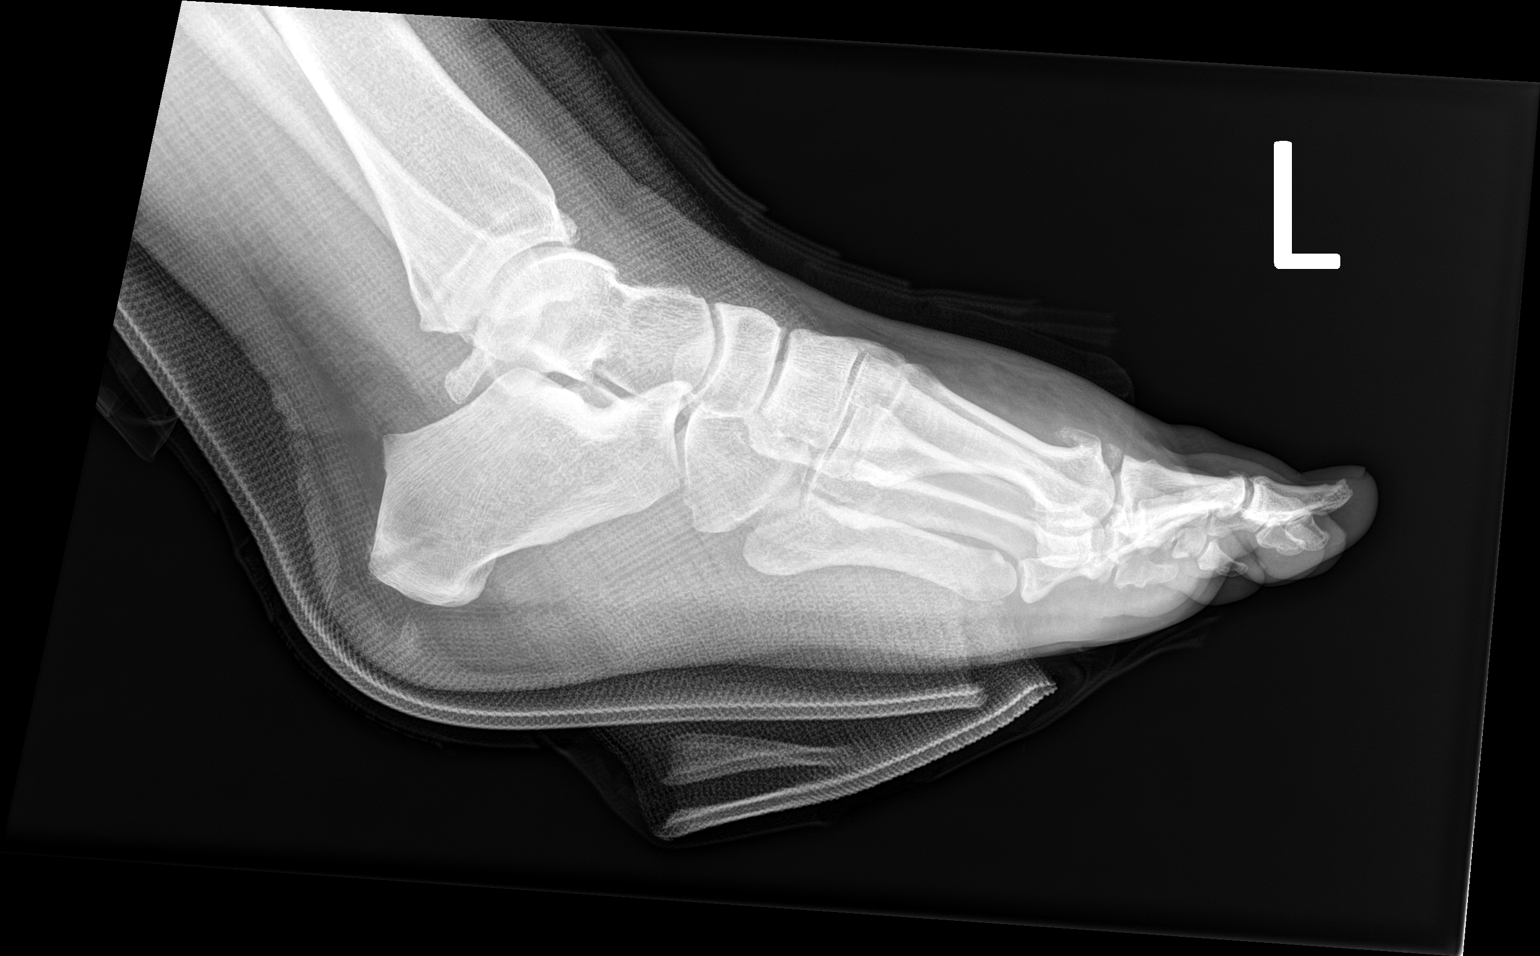

[3 of 3 positions shown; findings below may reference images not displayed]

FINDINGS: Right ankle: Overlying cast is present. There is no evidence of
fracture or dislocation. There is no evidence of arthropathy or
other focal bone abnormality. Soft tissues are unremarkable.

Right foot: Overlying cast is present. There are postsurgical
changes along the posterior aspect of the calcaneus which appear
within normal limits. There is no acute fracture or dislocation.
Joint spaces are maintained. There are minimal degenerative changes
at the first metatarsophalangeal joint with osteophyte formation.
Soft tissues are grossly within normal limits.
IMPRESSION: 1. Postsurgical changes of the calcaneus.
2. No acute fracture or dislocation of the right foot or right
ankle.

## 2023-08-25 ENCOUNTER — Encounter: Payer: Self-pay | Admitting: Family Medicine

## 2023-08-25 ENCOUNTER — Ambulatory Visit: Payer: Self-pay | Admitting: Family Medicine

## 2023-08-25 VITALS — BP 118/88 | HR 74 | Temp 99.1°F | Resp 18 | Ht 69.0 in | Wt 251.4 lb

## 2023-08-25 DIAGNOSIS — E66812 Obesity, class 2: Secondary | ICD-10-CM

## 2023-08-25 DIAGNOSIS — Z6837 Body mass index (BMI) 37.0-37.9, adult: Secondary | ICD-10-CM

## 2023-08-25 DIAGNOSIS — M130 Polyarthritis, unspecified: Secondary | ICD-10-CM

## 2023-08-25 NOTE — Progress Notes (Signed)
 Subjective:     Patient ID: Bradley Barr, male    DOB: [birth year], 43 y.o.   MRN: [id number]  Chief Complaint  Patient presents with   Transfer of Care    TOC to new pcp Discuss possible referrals and ortho clearance Joint pain Shoulder imaging, bilateral Bilateral knee imaging needed Ankle issues Lower back and right hip pain Discuss referral to weight loss clinic Discuss issues with male parts Fasting     HPI-last seen 09/15/20-video Discussed the use of AI scribe software for clinical note transcription with the patient, who gave verbal consent to proceed.  History of Present Illness Bradley Barr is a 43 year old male who presents for surgical clearance for left shoulder replacement.  He lost his job and insurance and was unable to afford health care.   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 These issues have persisted for an extended period, and he has undergone imaging of his shoulders. Previously, he self-paid for treatments due to insurance issues but now has Medicare Advantage coverage. [facility]  He has a history of multiple joint issues, including both knees and elbows, with the right knee being particularly problematic. The right knee frequently gives out, causing significant pain and swelling, especially on the lateral side and top of the knee. His right elbow has recently started to cause sharp pain, particularly when pushing off from a seated position. Additionally, he experiences dull aching pain in his lower right back and hip, which has been present for the last few months.  His past medical history includes Achilles tendon surgeries, with two surgeries on the right leg and one on the left leg, as well as an appendectomy. He has been deemed disabled due to orthopedic issues and mental health  concerns, including depression and anxiety, exacerbated by his inability to work and financial stress. He has a history of kidney stones, anxiety, depression, and pancreatitis, which was not related to alcohol use. No history of high blood pressure or diabetes. His family history includes arthritis, asthma, COPD, depression, and hyperthyroidism in his father.  He is currently on testosterone replacement therapy and estrogen blockers, which he receives through an online service. He undergoes blood tests every twelve weeks to monitor his levels. He reports some issues with libido, which he has experienced in the past.  He manages his weight through a calorie-restricted diet, initially losing weight to qualify for Achilles tendon surgery. He follows a diet of 1500 to 1800 calories per day, focusing on protein and reducing sugar intake. Recently, he has noticed an increase in cravings for sweets. Wants to go to wt mgmt program  No chest pain, shortness of breath, or dizziness with physical activity, although his orthopedic issues limit his mobility. He denies any gastrointestinal symptoms such as vomiting, diarrhea, or constipation. He has no current thoughts of suicide and feels hopeful about his medical care now that he has insurance coverage.  Has several other complaints he wants addressed, but time contraints.  Pt girlfriend getting blood xfusion so can't go to [place] for labs.  Will order at f/u     Health Maintenance Due  Topic Date Due   Medicare Annual Wellness (AWV)  Never done   Hepatitis B Vaccines (1 of 3 - 19+ 3-dose series) Never done    Past Medical History:  Diagnosis Date   Allergy  Anxiety    Depression    GERD (gastroesophageal reflux disease)    Kidney stones    Pancreatitis    Pancreatitis 2016    Past Surgical History:  Procedure Laterality Date   ANKLE SURGERY  2021   achilles R-2, 1 on L   APPENDECTOMY       Current Outpatient Medications:    TESTOSTERONE  IM, Inject into the muscle., Disp: , Rfl:    UNABLE TO FIND, Med Name: Estrogen Blocker, Disp: , Rfl:   No Known Allergies ROS neg/noncontributory except as noted HPI/below      Objective:     BP 118/88 (BP Location: Left Arm, Patient Position: Sitting, Cuff Size: Large)   Pulse 74   Temp 99.1 F (37.3 C) (Temporal)   Resp 18   Ht 5' 9 (1.753 m)   Wt 251 lb 6 oz (114 kg)   SpO2 99%   BMI 37.12 kg/m  Wt Readings from Last 3 Encounters:  08/25/23 251 lb 6 oz (114 kg)  12/22/20 (!) 315 lb (142.9 kg)  06/26/20 (!) 340 lb 3.2 oz (154.3 kg)    Physical Exam   Gen: WDWN NAD HEENT: NCAT, conjunctiva not injected, sclera nonicteric NECK:  supple, no thyromegaly, no nodes, no carotid bruits CARDIAC: RRR, S1S2+, no murmur. DP 2+B LUNGS: CTAB. No wheezes ABDOMEN:  BS+, soft, NTND, No HSM, no masses EXT:  no edema MSK: no gross abnormalities.  Limited rom L shoulder.  NEURO: A&O x3.  CN II-XII intact.  PSYCH: normal mood. Good eye contact.  talkative     Assessment & Plan:  Polyarthritis of multiple sites  Class 2 severe obesity due to excess calories with serious comorbidity and body mass index (BMI) of 37.0 to 37.9 in adult [facility])  Assessment and Plan Assessment & Plan Left Shoulder Osteoarthritis   Severe osteoarthritis in the left shoulder limits range of motion and strength, causing significant pain and necessitating surgical intervention. He is eager to return to work, and the surgery is part of a broader plan to address multiple joint issues. He is cleared for left shoulder replacement surgery using new technology for optimal recovery. Necessary paperwork for surgical clearance will be completed.  Multiple Joint Osteoarthritis   Chronic osteoarthritis affects multiple joints, including both shoulders, knees, and elbows. The right shoulder and knees are significantly affected, causing pain and functional limitations. Future surgical interventions are planned to optimize  recovery and facilitate his return to work. Coordination with orthopedic surgery for imaging and potential intervention on the right shoulder is underway. Knee surgeries are planned within 3-4 months of each other after shoulder surgeries.  Chronic Pain   Chronic pain is associated with osteoarthritis and multiple joint issues. He prefers non-narcotic pain management strategies. Pain levels range from 6-7, with sharp pain reaching 9. He is committed to managing pain without narcotics unless absolutely necessary. Non-narcotic pain management strategies will continue.  Anxiety and Depression   Anxiety and depression contribute to his disability status. He reports improved mental health with the prospect of receiving medical care and insurance coverage. There is no current suicidal ideation, and he expresses hope for future improvement.  Weight Management   Significant weight loss has been achieved through caloric restriction and fasting, but he is experiencing weight regain and increased cravings for sweets. Assistance to manage weight effectively is sought. A referral to a weight loss program at the [place] site is made.  Testosterone Replacement Therapy   He is on testosterone replacement  therapy and estrogen blockers, with regular monitoring every 12 weeks. He reports issues with libido, which may be related to past history or current therapy. These issues will be discussed in future visits for further evaluation and management.  General Health Maintenance   He has a history of kidney stones and pancreatitis, with no known hypertension or diabetes. He is a non-smoker and consumes alcohol rarely. Routine health screenings and blood work are required to assess current health status. He is advised against aspirin use 7 days prior to surgery to prevent bleeding complications. Blood work will be scheduled to assess for diabetes and other health markers, and a full STD screening will be performed at  next visit.  Follow-up   Follow-up is required for multiple health concerns and surgical planning. He is eager to address all health issues now that insurance coverage is available. A follow-up appointment will be scheduled in 1-2 weeks for blood work and further discussion of health issues. A referral to a healthy weight and wellness program will be completed.    Return in about 2 weeks (around 09/08/2023) for chronic follow-up.  Jenkins CHRISTELLA Carrel, MD

## 2023-08-25 NOTE — Patient Instructions (Signed)

## 2023-09-05 ENCOUNTER — Ambulatory Visit: Payer: Self-pay | Admitting: Family Medicine

## 2023-09-05 ENCOUNTER — Other Ambulatory Visit: Payer: Self-pay | Admitting: Family Medicine

## 2023-09-05 ENCOUNTER — Other Ambulatory Visit (HOSPITAL_COMMUNITY)
Admission: RE | Admit: 2023-09-05 | Discharge: 2023-09-05 | Disposition: A | Discharge status: Home / Self Care | Source: Ambulatory Visit | Attending: Family Medicine | Admitting: Family Medicine

## 2023-09-05 ENCOUNTER — Encounter: Payer: Self-pay | Admitting: Family Medicine

## 2023-09-05 ENCOUNTER — Ambulatory Visit (INDEPENDENT_AMBULATORY_CARE_PROVIDER_SITE_OTHER): Admitting: Family Medicine

## 2023-09-05 VITALS — BP 110/72 | HR 99 | Temp 97.8°F | Ht 69.0 in | Wt 251.6 lb

## 2023-09-05 DIAGNOSIS — R21 Rash and other nonspecific skin eruption: Secondary | ICD-10-CM

## 2023-09-05 DIAGNOSIS — Z79899 Other long term (current) drug therapy: Secondary | ICD-10-CM

## 2023-09-05 DIAGNOSIS — R3129 Other microscopic hematuria: Secondary | ICD-10-CM | POA: Diagnosis not present

## 2023-09-05 DIAGNOSIS — Z113 Encounter for screening for infections with a predominantly sexual mode of transmission: Secondary | ICD-10-CM | POA: Diagnosis present

## 2023-09-05 DIAGNOSIS — N528 Other male erectile dysfunction: Secondary | ICD-10-CM | POA: Diagnosis not present

## 2023-09-05 DIAGNOSIS — K047 Periapical abscess without sinus: Secondary | ICD-10-CM

## 2023-09-05 LAB — CBC WITH DIFFERENTIAL/PLATELET
Basophils Absolute: 0 K/uL (ref 0.0–0.1)
Basophils Relative: 0.6 % (ref 0.0–3.0)
Eosinophils Absolute: 0.4 K/uL (ref 0.0–0.7)
Eosinophils Relative: 6.2 % — ABNORMAL HIGH (ref 0.0–5.0)
HCT: 45.5 % (ref 39.0–52.0)
Hemoglobin: 15.2 g/dL (ref 13.0–17.0)
Lymphocytes Relative: 15.3 % (ref 12.0–46.0)
Lymphs Abs: 1 K/uL (ref 0.7–4.0)
MCHC: 33.4 g/dL (ref 30.0–36.0)
MCV: 88.5 fl (ref 78.0–100.0)
Monocytes Absolute: 0.5 K/uL (ref 0.1–1.0)
Monocytes Relative: 6.9 % (ref 3.0–12.0)
Neutro Abs: 4.9 K/uL (ref 1.4–7.7)
Neutrophils Relative %: 71 % (ref 43.0–77.0)
Platelets: 154 K/uL (ref 150.0–400.0)
RBC: 5.14 Mil/uL (ref 4.22–5.81)
RDW: 14.5 % (ref 11.5–15.5)
WBC: 6.8 K/uL (ref 4.0–10.5)

## 2023-09-05 LAB — COMPREHENSIVE METABOLIC PANEL WITH GFR
ALT: 42 U/L (ref 0–53)
AST: 31 U/L (ref 0–37)
Albumin: 4.4 g/dL (ref 3.5–5.2)
Alkaline Phosphatase: 54 U/L (ref 39–117)
BUN: 12 mg/dL (ref 6–23)
CO2: 28 meq/L (ref 19–32)
Calcium: 9.4 mg/dL (ref 8.4–10.5)
Chloride: 105 meq/L (ref 96–112)
Creatinine, Ser: 1.22 mg/dL (ref 0.40–1.50)
GFR: 74.1 mL/min (ref >60.00)
Glucose, Bld: 115 mg/dL — ABNORMAL HIGH (ref 70–99)
Potassium: 4.1 meq/L (ref 3.5–5.1)
Sodium: 141 meq/L (ref 135–145)
Total Bilirubin: 0.5 mg/dL (ref 0.2–1.2)
Total Protein: 7 g/dL (ref 6.0–8.3)

## 2023-09-05 LAB — TSH: TSH: 1.08 u[IU]/mL (ref 0.35–5.50)

## 2023-09-05 LAB — URINALYSIS, ROUTINE W REFLEX MICROSCOPIC
Bilirubin Urine: NEGATIVE
Hgb urine dipstick: NEGATIVE
Ketones, ur: NEGATIVE
Leukocytes,Ua: NEGATIVE
Nitrite: NEGATIVE
RBC / HPF: NONE SEEN (ref 0–?)
Specific Gravity, Urine: 1.02 (ref 1.000–1.030)
Total Protein, Urine: NEGATIVE
Urine Glucose: NEGATIVE
Urobilinogen, UA: 0.2 (ref 0.0–1.0)
pH: 6 (ref 5.0–8.0)

## 2023-09-05 LAB — LIPID PANEL
Cholesterol: 135 mg/dL (ref 0–200)
HDL: 32.6 mg/dL — ABNORMAL LOW (ref >39.00)
LDL Cholesterol: 91 mg/dL (ref 0–99)
NonHDL: 102.55
Total CHOL/HDL Ratio: 4
Triglycerides: 60 mg/dL (ref 0.0–149.0)
VLDL: 12 mg/dL (ref 0.0–40.0)

## 2023-09-05 LAB — HEMOGLOBIN A1C: Hgb A1c MFr Bld: 5.3 % (ref 4.6–6.5)

## 2023-09-05 MED ORDER — TADALAFIL 20 MG PO TABS
10.0000 mg | ORAL_TABLET | ORAL | 11 refills | Status: AC | PRN
Start: 2023-09-05 — End: ?

## 2023-09-05 MED ORDER — AMOXICILLIN 500 MG PO CAPS: 500.0000 mg | ORAL_CAPSULE | Freq: Three times a day (TID) | ORAL | 0 refills | Status: AC

## 2023-09-05 NOTE — Progress Notes (Signed)
 Std labs still pending.  Labs look good otherwise except kidney function a little borderline-could just be muscle mass.  Don't over do the ibuprofen /aleve 

## 2023-09-05 NOTE — Patient Instructions (Signed)
 It was very nice to see you today!  [facility] [address] [place], [place], [place] [address] [phone]    PLEASE NOTE:  If you had any lab tests please let us know if you have not heard back within a few days. You may see your results on MyChart before we have a chance to review them but we will give you a call once they are reviewed by [place]. If we ordered any referrals today, please let us know if you have not heard from their office within the next week.   Please try these tips to maintain a healthy lifestyle:  Eat most of your calories during the day when you are active. Eliminate processed foods including packaged sweets (pies, cakes, cookies), reduce intake of potatoes, white bread, white pasta, and white rice. Look for whole grain options, oat flour or almond flour.  Each meal should contain half fruits/vegetables, one quarter protein, and one quarter carbs (no bigger than a computer mouse).  Cut down on sweet beverages. This includes juice, soda, and sweet tea. Also watch fruit intake, though this is a healthier sweet option, it still contains natural sugar! Limit to 3 servings daily.  Drink at least 1 glass of water with each meal and aim for at least 8 glasses per day  Exercise at least 150 minutes every week.

## 2023-09-05 NOTE — Progress Notes (Signed)
 Subjective:     Patient ID: Bradley Barr, male    DOB: 1981/01/24, 43 y.o.   MRN: [id number]  Chief Complaint  Patient presents with   Medical Management of Chronic Issues    Pt in today for 2 week follow up; pt stated he would like antibiotics for his tooth, cannot get into dentist until 2 weeks; no changes other than tooth issue;     HPI Discussed the use of AI scribe software for clinical note transcription with the patient, who gave verbal consent to proceed.  History of Present Illness Bradley Barr is a 43 year old male who presents for blood work clearance for surgery and evaluation of dental pain.  He experiences dental pain on the left lower side, which began as soreness and has progressed to jaw pain. Despite regular flossing and brushing, he noticed increased pain when flossing and suspects a cap or filling may have come loose. He has a dental appointment in two weeks. No fever or drainage from the dental area is reported.  He is not currently on steroids but takes testosterone and estrogen blockers. He recently took Tylenol  for pain relief. Previous lab work from March 2025 showed hemoglobin at 16.4, testosterone greater than 1500, and estradiol at 20, but no glucose levels were checked.  He requests a full STD panel due to having multiple partners and a history of blood in urine and penile discoloration. He has experienced bleeding during urination and pain, which was previously investigated but not completed due to loss of insurance. No penile discharge is reported.  He reports erectile dysfunction issues, noting that while testosterone helps initially, it does not sustain erections. He experiences difficulty maintaining erections, which impacts his sexual activity. He has lost significant weight but continues to face these issues. He has previously been prescribed sildenafil .    There are no preventive care reminders to display for this patient.  Past  Medical History:  Diagnosis Date   Allergy    Anxiety    Depression    GERD (gastroesophageal reflux disease)    Kidney stones    Pancreatitis    Pancreatitis 2016    Past Surgical History:  Procedure Laterality Date   ANKLE SURGERY  2021   achilles R-2, 1 on L   APPENDECTOMY       Current Outpatient Medications:    amoxicillin  (AMOXIL ) 500 MG capsule, Take 1 capsule (500 mg total) by mouth 3 (three) times daily for 10 days., Disp: 30 capsule, Rfl: 0   tadalafil  (CIALIS ) 20 MG tablet, Take 0.5-1 tablets (10-20 mg total) by mouth every other day as needed for erectile dysfunction., Disp: 10 tablet, Rfl: 11   TESTOSTERONE IM, Inject into the muscle., Disp: , Rfl:    UNABLE TO FIND, Med Name: Estrogen Blocker, Disp: , Rfl:   No Known Allergies ROS neg/noncontributory except as noted HPI/below      Objective:     BP 110/72 (BP Location: Right Arm, Patient Position: Sitting, Cuff Size: Normal)   Pulse 99   Temp 97.8 F (36.6 C) (Temporal)   Ht 5' 9 (1.753 m)   Wt 251 lb 9.6 oz (114.1 kg)   SpO2 97%   BMI 37.15 kg/m  Wt Readings from Last 3 Encounters:  09/05/23 251 lb 9.6 oz (114.1 kg)  08/25/23 251 lb 6 oz (114 kg)  12/22/20 (!) 315 lb (142.9 kg)    Physical Exam   Gen: WDWN NAD HEENT: NCAT, conjunctiva not injected,  sclera nonicteric CARDIAC: RRR, S1S2+, no murmur.  EXT:  no edema MSK: no gross abnormalities.  NEURO: A&O x3.  CN II-XII intact.  PSYCH: normal mood. Good eye contact  Penis-some hyper/hypopigmentation and more spotted on distal shaft.      Assessment & Plan:  Routine screening for STI (sexually transmitted infection) -     Cytology - Non PAP; -     HepB+HepC+HIV Panel -     HSV(herpes simplex vrs) 1+2 ab-IgG -     RPR -     Urinalysis, Routine w reflex microscopic  Microhematuria -     Urinalysis, Routine w reflex microscopic -     Ambulatory referral to Urology  Other male erectile dysfunction -     Lipid panel -      Comprehensive metabolic panel with GFR -     CBC with Differential/Platelet -     Hemoglobin A1c -     TSH -     Tadalafil ; Take 0.5-1 tablets (10-20 mg total) by mouth every other day as needed for erectile dysfunction.  Dispense: 10 tablet; Refill: 11  High risk medication use -     Lipid panel -     Comprehensive metabolic panel with GFR -     CBC with Differential/Platelet  Rash of penis -     Ambulatory referral to Urology  Dental infection  Other orders -     Amoxicillin ; Take 1 capsule (500 mg total) by mouth 3 (three) times daily for 10 days.  Dispense: 30 capsule; Refill: 0  Assessment and Plan Assessment & Plan Dental Infection   He experiences pain and soreness in the left lower jaw, likely due to a dental infection. There is no fever or drainage. A dental appointment is scheduled in two weeks. The dentist recommended antibiotics to manage the potential infection before the appointment. Prescribe amoxicillin  for the dental infection.  Hematuria   He has intermittent hematuria with associated pain. A previous workup was incomplete due to loss of insurance. There is concern about recurring symptoms and potential underlying issues. A referral to a urologist is necessary for further evaluation, especially if symptoms persist. Also has rash on penis.  Not sure if normal variant of pigmentation, other.  Will have urol check.   Preoperative Clearance   He requires blood work for surgical clearance. There are concerns about potential undiagnosed diabetes or kidney issues affecting surgery. Order blood work and review results to ensure no contraindications for surgery.  Erectile Dysfunction   He has difficulty maintaining erections despite testosterone therapy. Symptoms have persisted for years, with some improvement post-testosterone injections. Weight loss has been ineffective. Cialis  was previously prescribed and will be tried again. Advise taking Cialis  at least 30 minutes  before sexual activity, effective for up to 36 hours. Start with half a pill to assess effectiveness and consider using GoodRx if insurance does not cover the medication.  Sexually Transmitted Infection Screening   He is sexually active with multiple partners and requests comprehensive STI screening. There are no current symptoms of discharge or known outbreaks, but he reports past issues with blood in urine and dark spots on the penis. He is concerned about potential dormant infections. Order a full STI panel including HIV, hepatitis C, syphilis, and herpes.  General Health Maintenance   He is on testosterone therapy and estrogen blockers, with regular monitoring of testosterone levels. No recent comprehensive blood work is available. Review testosterone levels and other relevant lab results from [facility].  Follow-up   He has multiple ongoing health concerns and is managing appointments with various specialists. Follow-up is needed to review lab results and ensure continuity of care. Schedule a follow-up appointment in three months unless lab results indicate the need for an earlier review.    Return in about 3 months (around 12/06/2023) for annual physical.  Jenkins CHRISTELLA Carrel, MD

## 2023-09-06 LAB — RPR: RPR Ser Ql: NONREACTIVE

## 2023-09-06 LAB — URINE CYTOLOGY ANCILLARY ONLY
Chlamydia: NEGATIVE
Comment: NEGATIVE
Comment: NORMAL
Neisseria Gonorrhea: NEGATIVE

## 2023-09-06 LAB — HSV(HERPES SIMPLEX VRS) I + II AB-IGG
HSV 1 IGG,TYPE SPECIFIC AB: 40.4 {index} — ABNORMAL HIGH
HSV 2 IGG,TYPE SPECIFIC AB: <0.9 {index}

## 2023-09-07 ENCOUNTER — Institutional Professional Consult (permissible substitution): Admitting: Nurse Practitioner

## 2023-09-07 ENCOUNTER — Ambulatory Visit: Payer: Self-pay | Admitting: Family Medicine

## 2023-09-07 LAB — HEPB+HEPC+HIV PANEL
HIV Screen 4th Generation wRfx: NONREACTIVE
Hep B C IgM: NEGATIVE
Hep B Core Total Ab: NEGATIVE
Hep B E Ab: NONREACTIVE
Hep B E Ag: NEGATIVE
Hep B Surface Ab, Qual: REACTIVE
Hep C Virus Ab: NONREACTIVE
Hepatitis B Surface Ag: NEGATIVE

## 2023-09-07 NOTE — Progress Notes (Signed)
 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  Could be cold cores, or previous exposure.

## 2023-09-13 ENCOUNTER — Ambulatory Visit (INDEPENDENT_AMBULATORY_CARE_PROVIDER_SITE_OTHER): Admitting: Nurse Practitioner

## 2023-09-13 ENCOUNTER — Encounter: Payer: Self-pay | Admitting: Nurse Practitioner

## 2023-09-13 VITALS — BP 128/77 | HR 68 | Temp 97.8°F | Ht 68.0 in | Wt 249.0 lb

## 2023-09-13 DIAGNOSIS — K760 Fatty (change of) liver, not elsewhere classified: Secondary | ICD-10-CM

## 2023-09-13 DIAGNOSIS — Z6837 Body mass index (BMI) 37.0-37.9, adult: Secondary | ICD-10-CM

## 2023-09-13 DIAGNOSIS — E66812 Obesity, class 2: Secondary | ICD-10-CM | POA: Diagnosis not present

## 2023-09-13 NOTE — Progress Notes (Signed)
 / Office: [phone]  /  Fax: [phone]   Initial Visit  Bradley Barr was seen in clinic today to evaluate for obesity. He is interested in losing weight to improve overall health and reduce the risk of weight related complications. He presents today to review program treatment options, initial physical assessment, and evaluation.     He was referred by: PCP  When asked what else they would like to accomplish? He states: Adopt a healthier eating pattern and lifestyle, Improve energy levels and physical activity, Improve existing medical conditions, and Improve quality of life   He wants to lose weight to be able to donate a kidney  When asked how has your weight affected you? He states: Contributed to medical problems, Contributed to orthopedic problems or mobility issues, Having fatigue, and Having poor endurance  Some associated conditions: fatty liver, chronic pain-knees, shoulder and feet, GAD, panic attacks, splenomegaly, achilles tendonesis, He has a history of kidney stones and pancreatitis (per PCP note)  Contributing factors: family history of obesity, reduced physical activity, chronic skipping of meals, and multiple weight loss attempts in the past  Weight promoting medications identified: None  Current nutrition plan: 3000 calories daily-uses myfitness pal  Current level of physical activity: limited due to shoulder pain, knee pain, feet pain-schedule for left shoulder surgery    Current or previous pharmacotherapy: took something in the past for one month  Response to medication: Lost weight and was able to maintain weight loss   Past medical history includes:   Past Medical History:  Diagnosis Date   Allergy    Anxiety    Depression    GERD (gastroesophageal reflux disease)    Kidney stones    Pancreatitis    Pancreatitis 2016     Objective:   BP 128/77   Pulse 68   Temp 97.8 F (36.6 C)   Ht 5' 8 (1.727 m)   Wt 249 lb (112.9 kg)   SpO2  98%   BMI 37.86 kg/m  He was weighed on the bioimpedance scale: Body mass index is 37.86 kg/m.  Peak Weight:356lb , Body Fat%:31.2, Visceral Fat Rating:16, Weight trend over the last 12 months: Decreasing  General:  Alert, oriented and cooperative. Patient is in no acute distress.  Respiratory: Normal respiratory effort, no problems with respiration noted   Gait: able to ambulate independently  Mental Status: Normal mood and affect. Normal behavior. Normal judgment and thought content.   DIAGNOSTIC DATA REVIEWED:  BMET    Component Value Date/Time   NA 141 09/05/2023 1228   NA 137 09/16/2013 1510   K 4.1 09/05/2023 1228   K 3.9 09/16/2013 1510   CL 105 09/05/2023 1228   CL 107 09/16/2013 1510   CO2 28 09/05/2023 1228   CO2 22 09/16/2013 1510   GLUCOSE 115 (H) 09/05/2023 1228   GLUCOSE 105 (H) 09/16/2013 1510   BUN 12 09/05/2023 1228   BUN 14 09/16/2013 1510   CREATININE 1.22 09/05/2023 1228   CREATININE 1.13 09/16/2013 1510   CALCIUM 9.4 09/05/2023 1228   CALCIUM 8.5 09/16/2013 1510   GFRNONAA >60 12/18/2014 0912   GFRNONAA >60 09/16/2013 1510   GFRAA >60 12/18/2014 0912   GFRAA >60 09/16/2013 1510   Lab Results  Component Value Date   HGBA1C 5.3 09/05/2023   No results found for: INSULIN CBC    Component Value Date/Time   WBC 6.8 09/05/2023 1228   RBC 5.14 09/05/2023 1228   HGB 15.2 09/05/2023 1228  HGB 17.2 09/16/2013 1510   HCT 45.5 09/05/2023 1228   HCT 50.2 09/16/2013 1510   PLT 154.0 09/05/2023 1228   PLT 157 09/16/2013 1510   MCV 88.5 09/05/2023 1228   MCV 93 09/16/2013 1510   MCH 30.8 05/30/2020 2340   MCHC 33.4 09/05/2023 1228   RDW 14.5 09/05/2023 1228   RDW 12.6 09/16/2013 1510   Iron/TIBC/Ferritin/ %Sat No results found for: IRON, TIBC, FERRITIN, IRONPCTSAT Lipid Panel     Component Value Date/Time   CHOL 135 09/05/2023 1228   TRIG 60.0 09/05/2023 1228   HDL 32.60 (L) 09/05/2023 1228   CHOLHDL 4 09/05/2023 1228   VLDL 12.0  09/05/2023 1228   LDLCALC 91 09/05/2023 1228   Hepatic Function Panel     Component Value Date/Time   PROT 7.0 09/05/2023 1228   PROT 8.0 09/16/2013 1510   ALBUMIN 4.4 09/05/2023 1228   ALBUMIN 3.8 09/16/2013 1510   AST 31 09/05/2023 1228   AST 34 09/16/2013 1510   ALT 42 09/05/2023 1228   ALT 61 09/16/2013 1510   ALKPHOS 54 09/05/2023 1228   ALKPHOS 85 09/16/2013 1510   BILITOT 0.5 09/05/2023 1228   BILITOT 0.5 09/16/2013 1510      Component Value Date/Time   TSH 1.08 09/05/2023 1228     Assessment and Plan:   Fatty liver disease, nonalcoholic Per US  abd on 06/15/08 Last CMP was 09/05/23-ALT and AST wnl   Obesity, Class II, BMI 35-39.9        Obesity Treatment / Action Plan:  Patient will work on garnering support from family and friends to begin weight loss journey. Will work on eliminating or reducing the presence of highly palatable, calorie dense foods in the home. Will complete provided nutritional and psychosocial assessment questionnaire before the next appointment. Will be scheduled for indirect calorimetry to determine resting energy expenditure in a fasting state.  This will allow us  to create a reduced calorie, high-protein meal plan to promote loss of fat mass while preserving muscle mass. Counseled on the health benefits of losing 5%-15% of total body weight. Was counseled on nutritional approaches to weight loss and benefits of reducing processed foods and consuming plant-based foods and high quality protein as part of nutritional weight management. Was counseled on pharmacotherapy and role as an adjunct in weight management.   Obesity Education Performed Today:  He was weighed on the bioimpedance scale and results were discussed and documented in the synopsis.  We discussed obesity as a disease and the importance of a more detailed evaluation of all the factors contributing to the disease.  We discussed the importance of long term lifestyle changes  which include nutrition, exercise and behavioral modifications as well as the importance of customizing this to his specific health and social needs.  We discussed the benefits of reaching a healthier weight to alleviate the symptoms of existing conditions and reduce the risks of the biomechanical, metabolic and psychological effects of obesity.  Bradley Barr appears to be in the action stage of change and states they are ready to start intensive lifestyle modifications and behavioral modifications.  30 minutes was spent today on this visit including the above counseling, pre-visit chart review, and post-visit documentation.  Reviewed by clinician on day of visit: allergies, medications, problem list, medical history, surgical history, family history, social history, and previous encounter notes pertinent to obesity diagnosis.    Bradley Barr Rondalyn Belford FNP-C

## 2023-09-22 DIAGNOSIS — Z0289 Encounter for other administrative examinations: Secondary | ICD-10-CM

## 2023-10-13 ENCOUNTER — Ambulatory Visit: Admitting: Family Medicine

## 2023-10-13 ENCOUNTER — Encounter: Payer: Self-pay | Admitting: Family Medicine

## 2023-10-13 VITALS — BP 129/78 | HR 77 | Temp 98.0°F | Ht 68.0 in | Wt 245.0 lb

## 2023-10-13 DIAGNOSIS — R948 Abnormal results of function studies of other organs and systems: Secondary | ICD-10-CM

## 2023-10-13 DIAGNOSIS — M130 Polyarthritis, unspecified: Secondary | ICD-10-CM

## 2023-10-13 DIAGNOSIS — F419 Anxiety disorder, unspecified: Secondary | ICD-10-CM

## 2023-10-13 DIAGNOSIS — Z6837 Body mass index (BMI) 37.0-37.9, adult: Secondary | ICD-10-CM

## 2023-10-13 DIAGNOSIS — R0602 Shortness of breath: Secondary | ICD-10-CM

## 2023-10-13 DIAGNOSIS — R5383 Other fatigue: Secondary | ICD-10-CM | POA: Diagnosis not present

## 2023-10-13 DIAGNOSIS — F32A Depression, unspecified: Secondary | ICD-10-CM | POA: Diagnosis not present

## 2023-10-13 DIAGNOSIS — K760 Fatty (change of) liver, not elsewhere classified: Secondary | ICD-10-CM

## 2023-10-13 DIAGNOSIS — E65 Localized adiposity: Secondary | ICD-10-CM

## 2023-10-13 DIAGNOSIS — E66812 Obesity, class 2: Secondary | ICD-10-CM

## 2023-10-13 NOTE — Progress Notes (Signed)
 At a Glance:  Vitals Temp: 98 F (36.7 C) BP: 129/78 Pulse Rate: 77 SpO2: 96 %   Anthropometric Measurements Height: 5' 8 (1.727 m) Weight: 245 lb (111.1 kg) BMI (Calculated): 37.26 Starting Weight: 245lb Peak Weight: 356lb   Body Composition  Body Fat %: 29 % Fat Mass (lbs): 71 lbs Muscle Mass (lbs): 165.4 lbs Total Body Water (lbs): 122.4 lbs Visceral Fat Rating : 15   Other Clinical Data RMR: 2074 Fasting: Yes Labs: Yes Today's Visit #: 1 Starting Date: 10/13/23    EKG: Normal sinus rhythm, rate 68.  Indirect Calorimeter completed today shows a VO2 of 301 and a REE of 2074.  His calculated basal metabolic rate is 7631 thus his basal metabolic rate is worse than expected.  Chief Complaint:  Obesity   Subjective:  Bradley Barr (MR# [id number]) is a 43 y.o. male who presents for evaluation and treatment of obesity and related comorbidities.   Bradley Barr is currently in the action stage of change and ready to dedicate time achieving and maintaining a healthier weight. Bradley Barr is interested in becoming our patient and working on intensive lifestyle modifications including (but not limited to) diet and exercise for weight loss.  Bradley Barr has been struggling with his weight. He has been unsuccessful in either losing weight, maintaining weight loss, or reaching his healthy weight goal. He has lost weight on his own with calorie counting but has found it harder to lose weight due to lack of physical activity, limited by polyarthritis.  He is getting ready to have L shoulder surgery with [facility] on 9/26.  He would like to get down <200 lb in order to be a kidney donor.  Bradley Barr's habits were reviewed today and are as follows: his desired weight loss is 50 lb, he has been heavy most of his life, his heaviest weight ever was 356 pounds, he has significant food cravings issues, he skips meals frequently, he frequently makes poor food choices, he  has problems with excessive hunger, and he frequently eats larger portions than normal.  He has been overweight since childhood.  His lives with him mom (eats meals separately) and has a girlfriend who is supportive.  Other Fatigue Ason admits to daytime somnolence and admits to waking up still tired. Patient has a history of symptoms of daytime fatigue. [name] generally gets 4 or 5 hours of sleep per night, and states that he has difficulty falling asleep. Snoring is present. Apneic episodes are not present. Epworth Sleepiness Score is 9.   Shortness of Breath Bradley Barr notes increasing shortness of breath with exercising and seems to be worsening over time with weight gain. He notes getting out of breath sooner with activity than he used to. This has gotten worse recently. Bradley Barr denies shortness of breath at rest or orthopnea.   Depression Screen Bradley Barr's Food and Mood (modified PHQ-9) score was 18.     08/25/2023   11:06 AM  Depression screen PHQ 2/9  Decreased Interest 1  Down, Depressed, Hopeless 1  PHQ - 2 Score 2  Altered sleeping 2  Tired, decreased energy 1  Change in appetite 2  Feeling bad or failure about yourself  2  Trouble concentrating 1  Moving slowly or fidgety/restless 0  Suicidal thoughts 0  PHQ-9 Score 10  Difficult doing work/chores Very difficult     Assessment and Plan:   Other Fatigue [name] does feel that his weight is causing his energy to be lower than it should be.  Fatigue may be related to obesity, depression or many other causes. Labs will be ordered, and in the meanwhile, Bradley Barr will focus on self care including making healthy food choices, increasing physical activity and focusing on stress reduction.  Shortness of Breath [name] does feel that he gets out of breath more easily that he used to when he exercises. Bradley Barr's shortness of breath appears to be obesity related and exercise induced. He has agreed  to work on weight loss and gradually increase exercise to treat his exercise induced shortness of breath. Will continue to monitor closely.  Bradley Barr had a positive depression screening. Depression is commonly associated with obesity and often results in emotional eating behaviors. We will monitor this closely and work on CBT to help improve the non-hunger eating patterns. Referral to Psychology may be required if no improvement is seen as he continues in our clinic.    Problem List Items Addressed This Visit     Fatty liver disease, nonalcoholic Abdominal ultrasound reviewed from 06/15/2008 showing hepatic steatosis 09/05/23 CMP shows normal liver enzymes Begin active plan for weight reduction Consider a Fibrosure scan Avoid use of semaglutide given hx of pancreatitis   Other Visit Diagnoses       SOBOE (shortness of breath on exertion)    -  Primary     Other fatigue       Relevant Orders   EKG 12-Lead (Completed)   VITAMIN D  25 Hydroxy (Vit-D Deficiency, Fractures)   Insulin , random   Folate   Vitamin B12     Class 2 severe obesity due to excess calories with serious comorbidity and body mass index (BMI) of 37.0 to 37.9 in adult [facility])          Polyarthritis of multiple sites     Physical activity has been greatly limited and he has c/o chronic pain, not on RX medication He is scheduled for L shoulder surgery and is managed by [facility] (Dr Arnaldo).  His lack of regular exercise due to polyarthritis due to RA has been a cause of his weight gain and lower metabolic rate.    We discussed PT/ water therapy options for the future.     Anxiety and depression  He is not on treatment for his mood and does endorse some emotional eating behaviors He is motivated to work on achieving a healthy weight and has a good support system Consider CBT/ use of Wellbutrin        Low basal metabolic rate    Reviewed fasting IC results with aptient His metabolic rate is 705 cal / day slower than  expected This is likely due to under- nutrition/ meal skipping/ lack of protein early in the day/ loss of muscle mass with less activity due to RA pain/ poor sleep at night.  We discussed the importance of sleep with goals reviewed, consider sleep study, adding in lean protein and fiber with meals while cutting out junk food (on prescribed meal plan) and a long term plan to ramp up exercise following his surgeries       Central adiposity   VFR is elevated at 15 with a goal <10 Assess for insulin  resistance on labs today   He has associated diagnoses of hepatic steatosis, low HDL Watch for a drop in abdominal circumference and VFR over time       [place] is currently in the action stage of change and his goal is to continue with weight loss efforts. I recommend Anson begin the  structured treatment plan as follows:  He has agreed to Category 3 Plan + 100 additional lunch calories/ 100 additional snack cal (1700 cal/ day)  Exercise goals: No exercise has been prescribed at this time.  Behavioral modification strategies:increasing lean protein intake, increasing vegetables, increase H2O intake, decrease liquid calories, decrease ETOH, increase high fiber foods, no skipping meals, meal planning and cooking strategies, keeping healthy foods in the home, better snacking choices, avoiding temptations, planning for success, and decrease junk food   He was informed of the importance of frequent follow-up visits to maximize his success with intensive lifestyle modifications for his multiple health conditions. He was informed we would discuss his lab results at his next visit unless there is a critical issue that needs to be addressed sooner. Kassius agreed to keep his next visit at the agreed upon time to discuss these results.  Objective:  General: Cooperative, alert, well developed, in no acute distress. HEENT: Conjunctivae and lids unremarkable. Cardiovascular: Regular rhythm.   Lungs: Normal work of breathing. Neurologic: No focal deficits.   Lab Results  Component Value Date   CREATININE 1.22 09/05/2023   BUN 12 09/05/2023   NA 141 09/05/2023   K 4.1 09/05/2023   CL 105 09/05/2023   CO2 28 09/05/2023   Lab Results  Component Value Date   ALT 42 09/05/2023   AST 31 09/05/2023   ALKPHOS 54 09/05/2023   BILITOT 0.5 09/05/2023   Lab Results  Component Value Date   HGBA1C 5.3 09/05/2023   No results found for: INSULIN  Lab Results  Component Value Date   TSH 1.08 09/05/2023   Lab Results  Component Value Date   CHOL 135 09/05/2023   HDL 32.60 (L) 09/05/2023   LDLCALC 91 09/05/2023   TRIG 60.0 09/05/2023   CHOLHDL 4 09/05/2023   Lab Results  Component Value Date   WBC 6.8 09/05/2023   HGB 15.2 09/05/2023   HCT 45.5 09/05/2023   MCV 88.5 09/05/2023   PLT 154.0 09/05/2023   No results found for: IRON, TIBC, FERRITIN  Attestation Statements:  Reviewed by clinician on day of visit: allergies, medications, problem list, medical history, surgical history, family history, social history, and previous encounter notes.  Time spent on visit including pre-visit chart review and post-visit charting and face- to face care including nutritional counseling, review of EKG, interpretation of body composition scale and indirect calorimetry and nutrition prescription  was 44 minutes.   Darice Haddock, D.O. DABFM, DABOM [facility] [address] [place], [place] [address] [phone]

## 2023-10-14 LAB — INSULIN, RANDOM: INSULIN: 13.1 u[IU]/mL (ref 2.6–24.9)

## 2023-10-14 LAB — FOLATE: Folate: 5.1 ng/mL (ref >3.0)

## 2023-10-14 LAB — VITAMIN B12: Vitamin B-12: 463 pg/mL (ref 232–1245)

## 2023-10-14 LAB — VITAMIN D 25 HYDROXY (VIT D DEFICIENCY, FRACTURES): Vit D, 25-Hydroxy: 75.4 ng/mL (ref 30.0–100.0)

## 2023-10-17 ENCOUNTER — Ambulatory Visit: Payer: Self-pay | Admitting: Family Medicine

## 2023-10-31 ENCOUNTER — Ambulatory Visit: Admitting: Family Medicine

## 2023-11-01 ENCOUNTER — Ambulatory Visit (INDEPENDENT_AMBULATORY_CARE_PROVIDER_SITE_OTHER)

## 2023-11-01 VITALS — Ht 69.0 in | Wt 245.0 lb

## 2023-11-01 DIAGNOSIS — Z0000 Encounter for general adult medical examination without abnormal findings: Secondary | ICD-10-CM

## 2023-11-01 NOTE — Patient Instructions (Signed)
 Mr. Bradley Barr,  Thank you for taking the time for your Medicare Wellness Visit. I appreciate your continued commitment to your health goals. Please review the care plan we discussed, and feel free to reach out if I can assist you further.  Medicare recommends these wellness visits once per year to help you and your care team stay ahead of potential health issues. These visits are designed to focus on prevention, allowing your provider to concentrate on managing your acute and chronic conditions during your regular appointments.  Please note that Annual Wellness Visits do not include a physical exam. Some assessments may be limited, especially if the visit was conducted virtually. If needed, we may recommend a separate in-person follow-up with your provider.  Ongoing Care Seeing your primary care provider every 3 to 6 months helps us  monitor your health and provide consistent, personalized care.   Referrals If a referral was made during today's visit and you haven't received any updates within two weeks, please contact the referred provider directly to check on the status.  Recommended Screenings:  Health Maintenance  Topic Date Due   Medicare Annual Wellness Visit  Never done   COVID-19 Vaccine (3 - Pfizer risk series) 12/20/2019   Flu Shot  Never done   HPV Vaccine (1 - 3-dose SCDM series) 02/20/2024*   Hepatitis B Vaccine (1 of 3 - 19+ 3-dose series) 09/04/2024*   DTaP/Tdap/Td vaccine (3 - Td or Tdap) 10/28/2026   Hepatitis C Screening  Completed   HIV Screening  Completed   Pneumococcal Vaccine  Aged Out   Meningitis B Vaccine  Aged Out  *Topic was postponed. The date shown is not the original due date.       11/01/2023    9:26 AM  Advanced Directives  Does Patient Have a Medical Advance Directive? No  Would patient like information on creating a medical advance directive? No - Patient declined   Advance Care Planning is important because it: Ensures you receive medical care  that aligns with your values, goals, and preferences. Provides guidance to your family and loved ones, reducing the emotional burden of decision-making during critical moments.  Vision: Annual vision screenings are recommended for early detection of glaucoma, cataracts, and diabetic retinopathy. These exams can also reveal signs of chronic conditions such as diabetes and high blood pressure.  Dental: Annual dental screenings help detect early signs of oral cancer, gum disease, and other conditions linked to overall health, including heart disease and diabetes.  Please see the attached documents for additional preventive care recommendations.

## 2023-11-01 NOTE — Progress Notes (Signed)
 Subjective:   Bradley Barr is a 43 y.o. who presents for a Medicare Wellness preventive visit.  As a reminder, Annual Wellness Visits don't include a physical exam, and some assessments may be limited, especially if this visit is performed virtually. We may recommend an in-person follow-up visit with your provider if needed.  Visit Complete: Virtual I connected with  Bradley Barr on 11/01/23 by a audio enabled telemedicine application and verified that I am speaking with the correct person using two identifiers.  Patient Location: Home  Provider Location: Office/Clinic  I discussed the limitations of evaluation and management by telemedicine. The patient expressed understanding and agreed to proceed.  Vital Signs: Because this visit was a virtual/telehealth visit, some criteria may be missing or patient reported. Any vitals not documented were not able to be obtained and vitals that have been documented are patient reported.  VideoDeclined- This patient declined Librarian, academic. Therefore the visit was completed with audio only.  Persons Participating in Visit: Patient.  AWV Questionnaire: No: Patient Medicare AWV questionnaire was not completed prior to this visit.  Cardiac Risk Factors include: advanced age (>35men, >71 women);obesity (BMI >30kg/m2);male gender     Objective:    Today's Vitals   11/01/23 0921  Weight: 245 lb (111.1 kg)  Height: 5' 9 (1.753 m)  PainSc: 10-Worst pain ever   Body mass index is 36.18 kg/m.     11/01/2023    9:26 AM 12/22/2020   10:48 PM 05/30/2020   11:03 PM 02/05/2015    7:13 PM 12/18/2014    9:05 AM 12/12/2014    6:39 AM  Advanced Directives  Does Patient Have a Medical Advance Directive? No No No No  No  No   Would patient like information on creating a medical advance directive? No - Patient declined  No - Patient declined No - patient declined information  No - patient declined  information  No - patient declined information      Data saved with a previous flowsheet row definition    Current Medications (verified) Outpatient Encounter Medications as of 11/01/2023  Medication Sig   tadalafil  (CIALIS ) 20 MG tablet Take 0.5-1 tablets (10-20 mg total) by mouth every other day as needed for erectile dysfunction.   TESTOSTERONE IM Inject into the muscle.   UNABLE TO FIND Med Name: Estrogen Blocker   No facility-administered encounter medications on file as of 11/01/2023.    Allergies (verified) Patient has no known allergies.   History: Past Medical History:  Diagnosis Date   Allergy    Anxiety    Depression    GERD (gastroesophageal reflux disease)    Joint pain    Kidney stones    Pancreatitis    Pancreatitis 2016   Rheumatoid arthritis (HCC)    Past Surgical History:  Procedure Laterality Date   ANKLE SURGERY  2021   achilles R-2, 1 on L   APPENDECTOMY     Family History  Problem Relation Age of Onset   Hypertension Mother    Arthritis Mother    Asthma Mother    Depression Mother    COPD Mother    Thyroid disease Father    Hypertension Father    Hyperthyroidism Father    Arthritis Maternal Grandmother    Asthma Maternal Grandmother    COPD Maternal Grandmother    Heart disease Maternal Grandmother    Hearing loss Maternal Grandmother    Hyperlipidemia Maternal Grandmother    Stroke Maternal  Grandmother    Cancer Maternal Grandmother    Hearing loss Maternal Grandfather    Heart disease Maternal Grandfather    Hyperlipidemia Maternal Grandfather    Diabetes Maternal Grandfather    ADD / ADHD Maternal Grandfather    Alcohol abuse Maternal Grandfather    Cancer Maternal Grandfather    Stroke Paternal Grandmother    Heart disease Paternal Grandfather 34 - 21   Early death Paternal Grandfather    Social History   Socioeconomic History   Marital status: Divorced    Spouse name: Not on file   Number of children: 0   Years of  education: Not on file   Highest education level: Not on file  Occupational History   Not on file  Tobacco Use   Smoking status: Former    Current packs/day: 0.00    Types: Cigarettes    Quit date: 03/03/2021    Years since quitting: 2.6   Smokeless tobacco: Never  Vaping Use   Vaping status: Former   Substances: Nicotine, Flavoring  Substance and Sexual Activity   Alcohol use: Yes    Comment: occasionally-about q 2wk   Drug use: Not Currently    Types: Marijuana    Comment: last used 2 years ago   Sexual activity: Yes    Birth control/protection: Condom  Other Topics Concern   Not on file  Social History Narrative   Disabled-ortho and moods   Social Drivers of Health   Financial Resource Strain: Low Risk  (11/01/2023)   Overall Financial Resource Strain (CARDIA)    Difficulty of Paying Living Expenses: Not hard at all  Food Insecurity: No Food Insecurity (11/01/2023)   Hunger Vital Sign    Worried About Running Out of Food in the Last Year: Never true    Ran Out of Food in the Last Year: Never true  Transportation Needs: No Transportation Needs (11/01/2023)   PRAPARE - Administrator, Civil Service (Medical): No    Lack of Transportation (Non-Medical): No  Physical Activity: Inactive (11/01/2023)   Exercise Vital Sign    Days of Exercise per Week: 0 days    Minutes of Exercise per Session: 0 min  Stress: No Stress Concern Present (11/01/2023)   [employer] of Occupational Health - Occupational Stress Questionnaire    Feeling of Stress: Not at all  Social Connections: Moderately Isolated (11/01/2023)   Social Connection and Isolation Panel    Frequency of Communication with Friends and Family: More than three times a week    Frequency of Social Gatherings with Friends and Family: Once a week    Attends Religious Services: 1 to 4 times per year    Active Member of [employer] or Organizations: No    Attends Engineer, structural: Never    Marital Status:  Divorced    Tobacco Counseling Counseling given: Not Answered    Clinical Intake:  Pre-visit preparation completed: Yes  Pain : 0-10 Pain Score: 10-Worst pain ever Pain Type: Chronic pain Pain Location: Generalized (all extremities) Pain Descriptors / Indicators: Aching, Sharp Pain Onset: More than a month ago Pain Frequency: Constant     BMI - recorded: 36.18 Nutritional Status: BMI > 30  Obese Nutritional Risks: None Diabetes: No  Lab Results  Component Value Date   HGBA1C 5.3 09/05/2023     How often do you need to have someone help you when you read instructions, pamphlets, or other written materials from your doctor or pharmacy?: 1 -  Never  Interpreter Needed?: No  Information entered by :: Ellouise Haws, LPN   Activities of Daily Living     11/01/2023    9:24 AM  In your present state of health, do you have any difficulty performing the following activities:  Hearing? 0  Vision? 0  Difficulty concentrating or making decisions? 0  Walking or climbing stairs? 1  Dressing or bathing? 0  Doing errands, shopping? 0  Preparing Food and eating ? Y  Comment assiatnce  Using the Toilet? N  In the past six months, have you accidently leaked urine? N  Do you have problems with loss of bowel control? N  Managing your Medications? N  Managing your Finances? N  Housekeeping or managing your Housekeeping? Y  Comment assistance    Patient Care Team: Wendolyn Jenkins Jansky, MD as PCP - General (Family Medicine)  I have updated your Care Teams any recent Medical Services you may have received from other providers in the past year.     Assessment:   This is a routine wellness examination for [place].  Hearing/Vision screen Hearing Screening - Comments:: Pt denies any hearing issues  Vision Screening - Comments:: Encouraged to follow up with provider    Goals Addressed             This Visit's Progress    Patient Stated       Weight loss 50 lbs         Depression Screen     11/01/2023    9:26 AM 08/25/2023   11:06 AM 05/29/2020    8:59 AM 04/28/2020    9:30 AM  PHQ 2/9 Scores  PHQ - 2 Score 1 2 0 0  PHQ- 9 Score  10      Fall Risk     11/01/2023    9:28 AM 09/05/2023   11:31 AM 08/25/2023   10:27 AM  Fall Risk   Falls in the past year? 0 0 0  Number falls in past yr: 0 0 0  Injury with Fall? 0 0 0  Risk for fall due to : Impaired balance/gait;Impaired mobility No Fall Risks No Fall Risks  Follow up Falls prevention discussed Falls evaluation completed Falls evaluation completed    MEDICARE RISK AT HOME:  Medicare Risk at Home Any stairs in or around the home?: Yes If so, are there any without handrails?: No Home free of loose throw rugs in walkways, pet beds, electrical cords, etc?: Yes Adequate lighting in your home to reduce risk of falls?: Yes Life alert?: No Use of a cane, walker or w/c?: Yes Grab bars in the bathroom?: No Shower chair or bench in shower?: Yes Elevated toilet seat or a handicapped toilet?: No  TIMED UP AND GO:  Was the test performed?  No  Cognitive Function: 6CIT completed        11/01/2023    9:29 AM  6CIT Screen  What Year? 0 points  What month? 0 points  What time? 0 points  Count back from 20 0 points  Months in reverse 0 points  Repeat phrase 0 points  Total Score 0 points    Immunizations Immunization History  Administered Date(s) Administered   PFIZER(Purple Top)SARS-COV-2 Vaccination 11/01/2019, 11/22/2019   Tdap 10/21/2010, 10/27/2016    Screening Tests Health Maintenance  Topic Date Due   COVID-19 Vaccine (3 - Pfizer risk series) 12/20/2019   Influenza Vaccine  Never done   HPV VACCINES (1 - 3-dose SCDM series) 02/20/2024 (Originally  01/20/2010)   Hepatitis B Vaccines 19-59 Average Risk (1 of 3 - 19+ 3-dose series) 09/04/2024 (Originally 01/20/2002)   Medicare Annual Wellness (AWV)  10/31/2024   DTaP/Tdap/Td (3 - Td or Tdap) 10/28/2026   Hepatitis C Screening  Completed    HIV Screening  Completed   Pneumococcal Vaccine  Aged Out   Meningococcal B Vaccine  Aged Out    Health Maintenance Items Addressed: See Nurse Notes at the end of this note  Additional Screening:  Vision Screening: Recommended annual ophthalmology exams for early detection of glaucoma and other disorders of the eye. Is the patient up to date with their annual eye exam?  No  Who is the provider or what is the name of the office in which the patient attends annual eye exams? Encouraged to follow up with provider   Dental Screening: Recommended annual dental exams for proper oral hygiene  Community Resource Referral / Chronic Care Management: CRR required this visit?  No   CCM required this visit?  No   Plan:    I have personally reviewed and noted the following in the patient's chart:   Medical and social history Use of alcohol, tobacco or illicit drugs  Current medications and supplements including opioid prescriptions. Patient is not currently taking opioid prescriptions. Functional ability and status Nutritional status Physical activity Advanced directives List of other physicians Hospitalizations, surgeries, and ER visits in previous 12 months Vitals Screenings to include cognitive, depression, and falls Referrals and appointments  In addition, I have reviewed and discussed with patient certain preventive protocols, quality metrics, and best practice recommendations. A written personalized care plan for preventive services as well as general preventive health recommendations were provided to patient.   Ellouise VEAR Haws, LPN   [id number]   After Visit Summary: (MyChart) Due to this being a telephonic visit, the after visit summary with patients personalized plan was offered to patient via MyChart   Notes: Nothing significant to report at this time. Declined flu vaccine at this time

## 2023-11-04 NOTE — Progress Notes (Addendum)
 COVID Vaccine Completed: yes  Date of COVID positive in last 90 days:  PCP - Jenkins Earnie Carrel, MD Cardiologist - n/a  Medical/cardiac clearance in media tab dated 10/19/23  Chest x-ray - N/A EKG - 10/13/23 Epic Stress Test - N/A ECHO - N/A Cardiac Cath - n/a Pacemaker/ICD device last checked:N/A Spinal Cord Stimulator:N/A  Bowel Prep - N/A  Sleep Study - N/A CPAP -   Fasting Blood Sugar - N/A Checks Blood Sugar _____ times a day  Last dose of GLP1 agonist-  N/A GLP1 instructions:  Do not take after     Last dose of SGLT-2 inhibitors-  N/A SGLT-2 instructions:  Do not take after     Blood Thinner Instructions: N/A Last dose:   Time: Aspirin Instructions:N/A Last Dose:  Activity level: Can go up a flight of stairs and perform activities of daily living without stopping and without symptoms of chest pain or shortness of breath.   Anesthesia review: N/A  Patient denies shortness of breath, fever, cough and chest pain at PAT appointment  Patient verbalized understanding of instructions that were given to them at the PAT appointment. Patient was also instructed that they will need to review over the PAT instructions again at home before surgery.

## 2023-11-04 NOTE — Patient Instructions (Addendum)
 SURGICAL WAITING ROOM VISITATION  Patients having surgery or a procedure may have no more than 2 support people in the waiting area - these visitors may rotate.    Children under the age of 79 must have an adult with them who is not the patient.  Visitors with respiratory illnesses are discouraged from visiting and should remain at home.  If the patient needs to stay at the hospital during part of their recovery, the visitor guidelines for inpatient rooms apply. Pre-op nurse will coordinate an appropriate time for 1 support person to accompany patient in pre-op.  This support person may not rotate.    Please refer to the [facility] website for the visitor guidelines for Inpatients (after your surgery is over and you are in a regular room).    Your procedure is scheduled on: 11/18/23   Report to [facility]    Report to admitting at 5:15 AM   Call this number if you have problems the morning of surgery [phone]   Do not eat food :After Midnight.   After Midnight you may have the following liquids until 4:15 AM DAY OF SURGERY  Water Non-Citrus Juices (without pulp, NO RED-Apple, White grape, White cranberry) Black Coffee (NO MILK/CREAM OR CREAMERS, sugar ok)  Clear Tea (NO MILK/CREAM OR CREAMERS, sugar ok) regular and decaf                             Plain Jell-O (NO RED)                                           Fruit ices (not with fruit pulp, NO RED)                                     Popsicles (NO RED)                                                               Sports drinks like Gatorade (NO RED)   The day of surgery:  Drink ONE (1) Pre-Surgery Clear Ensure at 4:15 AM the morning of surgery. Drink in one sitting. Do not sip.  This drink was given to you during your hospital  pre-op appointment visit. Nothing else to drink after completing the  Pre-Surgery Clear Ensure.          If you have questions, please contact your surgeon's  office.   FOLLOW BOWEL PREP AND ANY ADDITIONAL PRE OP INSTRUCTIONS YOU RECEIVED FROM YOUR SURGEON'S OFFICE!!!     Oral Hygiene is also important to reduce your risk of infection.                                    Remember - BRUSH YOUR TEETH THE MORNING OF SURGERY WITH YOUR REGULAR TOOTHPASTE  DENTURES WILL BE REMOVED PRIOR TO SURGERY PLEASE DO NOT APPLY Poly grip OR ADHESIVES!!!   Stop all vitamins and herbal supplements 7 days before surgery.  Take these medicines the morning of surgery with A SIP OF WATER: None                              You may not have any metal on your body including jewelry, and body piercing             Do not wear lotions, powders, cologne, or deodorant              Men may shave face and neck.   Do not bring valuables to the hospital.  IS NOT             RESPONSIBLE   FOR VALUABLES.   Contacts, glasses, dentures or bridgework may not be worn into surgery.   Bring small overnight bag day of surgery.   DO NOT BRING YOUR HOME MEDICATIONS TO THE HOSPITAL. PHARMACY WILL DISPENSE MEDICATIONS LISTED ON YOUR MEDICATION LIST TO YOU DURING YOUR ADMISSION IN THE HOSPITAL!    Patients discharged on the day of surgery will not be allowed to drive home.  Someone NEEDS to stay with you for the first 24 hours after anesthesia.              Please read over the following fact sheets you were given: IF YOU HAVE QUESTIONS ABOUT YOUR PRE-OP INSTRUCTIONS PLEASE CALL [phone] [place].   If you received a COVID test during your pre-op visit  it is requested that you wear a mask when out in public, stay away from anyone that may not be feeling well and notify your surgeon if you develop symptoms. If you test positive for Covid or have been in contact with anyone that has tested positive in the last 10 days please notify you surgeon.      Pre-operative 5 CHG Bath Instructions   You can play a key role in reducing the risk of infection after surgery.  Your skin needs to be as free of germs as possible. You can reduce the number of germs on your skin by washing with CHG (chlorhexidine  gluconate) soap before surgery. CHG is an antiseptic soap that kills germs and continues to kill germs even after washing.   DO NOT use if you have an allergy to chlorhexidine /CHG or antibacterial soaps. If your skin becomes reddened or irritated, stop using the CHG and notify one of our RNs at [phone].   Please shower with the CHG soap starting 4 days before surgery using the following schedule:     Please keep in mind the following:  DO NOT shave, including legs and underarms, starting the day of your first shower.   You may shave your face at any point before/day of surgery.  Place clean sheets on your bed the day you start using CHG soap. Use a clean washcloth (not used since being washed) for each shower. DO NOT sleep with pets once you start using the CHG.   CHG Shower Instructions:  If you choose to wash your hair and private area, wash first with your normal shampoo/soap.  After you use shampoo/soap, rinse your hair and body thoroughly to remove shampoo/soap residue.  Turn the water OFF and apply about 3 tablespoons (45 ml) of CHG soap to a CLEAN washcloth.  Apply CHG soap ONLY FROM YOUR NECK DOWN TO YOUR TOES (washing for 3-5 minutes)  DO NOT use CHG soap on face, private areas, open wounds, or sores.  Pay special attention to  the area where your surgery is being performed.  If you are having back surgery, having someone wash your back for you may be helpful. Wait 2 minutes after CHG soap is applied, then you may rinse off the CHG soap.  Pat dry with a clean towel  Put on clean clothes/pajamas   If you choose to wear lotion, please use ONLY the CHG-compatible lotions on the back of this paper.     Additional instructions for the day of surgery: DO NOT APPLY any lotions, deodorants, cologne, or perfumes.   Put on clean/comfortable clothes.   Brush your teeth.  Ask your nurse before applying any prescription medications to the skin.      CHG Compatible Lotions   Aveeno Moisturizing lotion  Cetaphil Moisturizing Cream  Cetaphil Moisturizing Lotion  Clairol Herbal Essence Moisturizing Lotion, Dry Skin  Clairol Herbal Essence Moisturizing Lotion, Extra Dry Skin  Clairol Herbal Essence Moisturizing Lotion, Normal Skin  Curel Age Defying Therapeutic Moisturizing Lotion with Alpha Hydroxy  Curel Extreme Care Body Lotion  Curel Soothing Hands Moisturizing Hand Lotion  Curel Therapeutic Moisturizing Cream, Fragrance-Free  Curel Therapeutic Moisturizing Lotion, Fragrance-Free  Curel Therapeutic Moisturizing Lotion, Original Formula  Eucerin Daily Replenishing Lotion  Eucerin Dry Skin Therapy Plus Alpha Hydroxy Crme  Eucerin Dry Skin Therapy Plus Alpha Hydroxy Lotion  Eucerin Original Crme  Eucerin Original Lotion  Eucerin Plus Crme Eucerin Plus Lotion  Eucerin TriLipid Replenishing Lotion  Keri Anti-Bacterial Hand Lotion  Keri Deep Conditioning Original Lotion Dry Skin Formula Softly Scented  Keri Deep Conditioning Original Lotion, Fragrance Free Sensitive Skin Formula  Keri Lotion Fast Absorbing Fragrance Free Sensitive Skin Formula  Keri Lotion Fast Absorbing Softly Scented Dry Skin Formula  Keri Original Lotion  Keri Skin Renewal Lotion Keri Silky Smooth Lotion  Keri Silky Smooth Sensitive Skin Lotion  Nivea Body Creamy Conditioning Oil  Nivea Body Extra Enriched Lotion  Nivea Body Original Lotion  Nivea Body Sheer Moisturizing Lotion Nivea Crme  Nivea Skin Firming Lotion  NutraDerm 30 Skin Lotion  NutraDerm Skin Lotion  NutraDerm Therapeutic Skin Cream  NutraDerm Therapeutic Skin Lotion  ProShield Protective Hand Cream  Provon moisturizing lotion   Incentive Spirometer  An incentive spirometer is a tool that can help keep your lungs clear and active. This tool measures how well you are filling your lungs  with each breath. Taking long deep breaths may help reverse or decrease the chance of developing breathing (pulmonary) problems (especially infection) following: A long period of time when you are unable to move or be active. BEFORE THE PROCEDURE  If the spirometer includes an indicator to show your best effort, your nurse or respiratory therapist will set it to a desired goal. If possible, sit up straight or lean slightly forward. Try not to slouch. Hold the incentive spirometer in an upright position. INSTRUCTIONS FOR USE  Sit on the edge of your bed if possible, or sit up as far as you can in bed or on a chair. Hold the incentive spirometer in an upright position. Breathe out normally. Place the mouthpiece in your mouth and seal your lips tightly around it. Breathe in slowly and as deeply as possible, raising the piston or the ball toward the top of the column. Hold your breath for 3-5 seconds or for as long as possible. Allow the piston or ball to fall to the bottom of the column. Remove the mouthpiece from your mouth and breathe out normally. Rest for a few seconds and repeat  Steps 1 through 7 at least 10 times every 1-2 hours when you are awake. Take your time and take a few normal breaths between deep breaths. The spirometer may include an indicator to show your best effort. Use the indicator as a goal to work toward during each repetition. After each set of 10 deep breaths, practice coughing to be sure your lungs are clear. If you have an incision (the cut made at the time of surgery), support your incision when coughing by placing a pillow or rolled up towels firmly against it. Once you are able to get out of bed, walk around indoors and cough well. You may stop using the incentive spirometer when instructed by your caregiver.  RISKS AND COMPLICATIONS Take your time so you do not get dizzy or light-headed. If you are in pain, you may need to take or ask for pain medication before doing  incentive spirometry. It is harder to take a deep breath if you are having pain. AFTER USE Rest and breathe slowly and easily. It can be helpful to keep track of a log of your progress. Your caregiver can provide you with a simple table to help with this. If you are using the spirometer at home, follow these instructions: SEEK MEDICAL CARE IF:  You are having difficultly using the spirometer. You have trouble using the spirometer as often as instructed. Your pain medication is not giving enough relief while using the spirometer. You develop fever of 100.5 F (38.1 C) or higher. SEEK IMMEDIATE MEDICAL CARE IF:  You cough up bloody sputum that had not been present before. You develop fever of 102 F (38.9 C) or greater. You develop worsening pain at or near the incision site. MAKE SURE YOU:  Understand these instructions. Will watch your condition. Will get help right away if you are not doing well or get worse. Document Released: 06/21/2006 Document Revised: 05/03/2011 Document Reviewed: 08/22/2006 [facility] Patient Information 2014 [place], [place].   ________________________________________________________________________

## 2023-11-07 ENCOUNTER — Encounter (HOSPITAL_COMMUNITY): Payer: Self-pay

## 2023-11-07 ENCOUNTER — Encounter (HOSPITAL_COMMUNITY)
Admission: RE | Admit: 2023-11-07 | Discharge: 2023-11-07 | Disposition: A | Discharge status: Home / Self Care | Source: Ambulatory Visit | Attending: Orthopedic Surgery

## 2023-11-07 ENCOUNTER — Other Ambulatory Visit: Payer: Self-pay

## 2023-11-07 VITALS — BP 144/97 | HR 78 | Temp 98.3°F | Resp 16 | Ht 69.0 in | Wt 247.0 lb

## 2023-11-07 DIAGNOSIS — Z01818 Encounter for other preprocedural examination: Secondary | ICD-10-CM | POA: Diagnosis present

## 2023-11-07 DIAGNOSIS — K760 Fatty (change of) liver, not elsewhere classified: Secondary | ICD-10-CM | POA: Insufficient documentation

## 2023-11-07 HISTORY — DX: Pneumonia, unspecified organism: J18.9

## 2023-11-07 LAB — COMPREHENSIVE METABOLIC PANEL WITH GFR
ALT: 25 U/L (ref 0–44)
AST: 17 U/L (ref 15–41)
Albumin: 4.4 g/dL (ref 3.5–5.0)
Alkaline Phosphatase: 67 U/L (ref 38–126)
Anion gap: 7 (ref 5–15)
BUN: 15 mg/dL (ref 6–20)
CO2: 26 mmol/L (ref 22–32)
Calcium: 9.4 mg/dL (ref 8.9–10.3)
Chloride: 105 mmol/L (ref 98–111)
Creatinine, Ser: 1.05 mg/dL (ref 0.61–1.24)
GFR, Estimated: >60 mL/min (ref >60)
Glucose, Bld: 112 mg/dL — ABNORMAL HIGH (ref 70–99)
Potassium: 4.9 mmol/L (ref 3.5–5.1)
Sodium: 138 mmol/L (ref 135–145)
Total Bilirubin: 0.4 mg/dL (ref 0.0–1.2)
Total Protein: 7.1 g/dL (ref 6.5–8.1)

## 2023-11-07 LAB — CBC
HCT: 48.9 % (ref 39.0–52.0)
Hemoglobin: 16.1 g/dL (ref 13.0–17.0)
MCH: 30 pg (ref 26.0–34.0)
MCHC: 32.9 g/dL (ref 30.0–36.0)
MCV: 91.2 fL (ref 80.0–100.0)
Platelets: 169 K/uL (ref 150–400)
RBC: 5.36 MIL/uL (ref 4.22–5.81)
RDW: 13.8 % (ref 11.5–15.5)
WBC: 6.8 K/uL (ref 4.0–10.5)
nRBC: 0 % (ref 0.0–0.2)

## 2023-11-07 LAB — SURGICAL PCR SCREEN
MRSA, PCR: NEGATIVE
Staphylococcus aureus: NEGATIVE

## 2023-11-08 ENCOUNTER — Ambulatory Visit (INDEPENDENT_AMBULATORY_CARE_PROVIDER_SITE_OTHER): Admitting: Family Medicine

## 2023-11-08 ENCOUNTER — Encounter: Payer: Self-pay | Admitting: Family Medicine

## 2023-11-08 VITALS — BP 117/76 | HR 76 | Temp 98.6°F | Ht 69.0 in | Wt 244.0 lb

## 2023-11-08 DIAGNOSIS — M130 Polyarthritis, unspecified: Secondary | ICD-10-CM | POA: Diagnosis not present

## 2023-11-08 DIAGNOSIS — Z6836 Body mass index (BMI) 36.0-36.9, adult: Secondary | ICD-10-CM

## 2023-11-08 DIAGNOSIS — E66812 Obesity, class 2: Secondary | ICD-10-CM

## 2023-11-08 DIAGNOSIS — K760 Fatty (change of) liver, not elsewhere classified: Secondary | ICD-10-CM | POA: Diagnosis not present

## 2023-11-08 DIAGNOSIS — F32A Depression, unspecified: Secondary | ICD-10-CM | POA: Diagnosis not present

## 2023-11-08 DIAGNOSIS — F419 Anxiety disorder, unspecified: Secondary | ICD-10-CM | POA: Diagnosis not present
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
## 2023-11-08 NOTE — Patient Instructions (Signed)
 Continue lean protein with all meals  Avoid foods and drinks with over 8 g of added sugar per serving   Allow 1/4 plate dinner starch with dinner Avoid fries/ chips/ sweets  Avoid eating past 8 pm Hydrate well with water Allow more non starchy veggies/ salads  Stay as active as you can

## 2023-11-18 ENCOUNTER — Ambulatory Visit (HOSPITAL_COMMUNITY)
Admission: RE | Admit: 2023-11-18 | Discharge: 2023-11-18 | Disposition: A | Discharge status: Home / Self Care | Source: Ambulatory Visit | Attending: Orthopedic Surgery | Admitting: Orthopedic Surgery

## 2023-11-18 ENCOUNTER — Ambulatory Visit (HOSPITAL_COMMUNITY): Payer: Self-pay | Admitting: Certified Registered Nurse Anesthetist

## 2023-11-18 ENCOUNTER — Other Ambulatory Visit: Payer: Self-pay

## 2023-11-18 ENCOUNTER — Encounter (HOSPITAL_COMMUNITY): Payer: Self-pay | Admitting: Orthopedic Surgery

## 2023-11-18 ENCOUNTER — Ambulatory Visit (HOSPITAL_COMMUNITY): Payer: Self-pay | Admitting: Medical

## 2023-11-18 ENCOUNTER — Ambulatory Visit (HOSPITAL_COMMUNITY)

## 2023-11-18 ENCOUNTER — Encounter (HOSPITAL_COMMUNITY)
Admission: RE | Disposition: A | Discharge status: Home / Self Care | Payer: Self-pay | Source: Ambulatory Visit | Attending: Orthopedic Surgery

## 2023-11-18 DIAGNOSIS — M19012 Primary osteoarthritis, left shoulder: Secondary | ICD-10-CM

## 2023-11-18 DIAGNOSIS — Z87891 Personal history of nicotine dependence: Secondary | ICD-10-CM | POA: Insufficient documentation

## 2023-11-18 DIAGNOSIS — M069 Rheumatoid arthritis, unspecified: Secondary | ICD-10-CM | POA: Diagnosis not present

## 2023-11-18 DIAGNOSIS — F419 Anxiety disorder, unspecified: Secondary | ICD-10-CM | POA: Diagnosis not present

## 2023-11-18 DIAGNOSIS — F32A Depression, unspecified: Secondary | ICD-10-CM | POA: Diagnosis not present

## 2023-11-18 DIAGNOSIS — K219 Gastro-esophageal reflux disease without esophagitis: Secondary | ICD-10-CM | POA: Diagnosis not present

## 2023-11-18 HISTORY — PX: SHOULDER HEMI-ARTHROPLASTY: SHX5049

## 2023-11-18 SURGERY — HEMIARTHROPLASTY, SHOULDER
Anesthesia: Regional | Site: Shoulder | Laterality: Left

## 2023-11-18 MED ORDER — SUGAMMADEX SODIUM 200 MG/2ML IV SOLN
INTRAVENOUS | Status: AC
  Filled 2023-11-18: qty 2

## 2023-11-18 MED ORDER — ROCURONIUM BROMIDE 10 MG/ML (PF) SYRINGE
PREFILLED_SYRINGE | INTRAVENOUS | Status: AC
  Filled 2023-11-18: qty 10

## 2023-11-18 MED ORDER — ESMOLOL HCL 100 MG/10ML IV SOLN
INTRAVENOUS | Status: AC
Start: 2023-11-18 — End: 2023-11-18
  Filled 2023-11-18: qty 10

## 2023-11-18 MED ORDER — LIDOCAINE HCL (PF) 2 % IJ SOLN
INTRAMUSCULAR | Status: AC
  Filled 2023-11-18: qty 5

## 2023-11-18 MED ORDER — ACETAMINOPHEN 10 MG/ML IV SOLN
INTRAVENOUS | Status: AC
  Filled 2023-11-18: qty 100

## 2023-11-18 MED ORDER — DEXAMETHASONE SODIUM PHOSPHATE 4 MG/ML IJ SOLN
INTRAMUSCULAR | Status: DC | PRN
  Administered 2023-11-18: 5 mg via INTRAVENOUS

## 2023-11-18 MED ORDER — CEFAZOLIN SODIUM-DEXTROSE 2-4 GM/100ML-% IV SOLN
2.0000 g | INTRAVENOUS | Status: AC
  Administered 2023-11-18: 2 g via INTRAVENOUS
  Filled 2023-11-18: qty 100

## 2023-11-18 MED ORDER — PHENYLEPHRINE 80 MCG/ML (10ML) SYRINGE FOR IV PUSH (FOR BLOOD PRESSURE SUPPORT)
PREFILLED_SYRINGE | INTRAVENOUS | Status: DC | PRN
  Administered 2023-11-18 (×3): 120 ug via INTRAVENOUS

## 2023-11-18 MED ORDER — PROPOFOL 10 MG/ML IV BOLUS
INTRAVENOUS | Status: AC
  Filled 2023-11-18: qty 20

## 2023-11-18 MED ORDER — VANCOMYCIN HCL 1000 MG IV SOLR
INTRAVENOUS | Status: AC
  Filled 2023-11-18: qty 20

## 2023-11-18 MED ORDER — FENTANYL CITRATE (PF) 100 MCG/2ML IJ SOLN
INTRAMUSCULAR | Status: AC
  Filled 2023-11-18: qty 2

## 2023-11-18 MED ORDER — PROPOFOL 10 MG/ML IV BOLUS
INTRAVENOUS | Status: DC | PRN
  Administered 2023-11-18: 200 mg via INTRAVENOUS

## 2023-11-18 MED ORDER — ONDANSETRON 4 MG PO TBDP: 4.0000 mg | ORAL_TABLET | Freq: Three times a day (TID) | ORAL | 0 refills | Status: DC | PRN

## 2023-11-18 MED ORDER — LACTATED RINGERS IV SOLN: INTRAVENOUS | Status: DC

## 2023-11-18 MED ORDER — FENTANYL CITRATE PF 50 MCG/ML IJ SOSY
PREFILLED_SYRINGE | INTRAMUSCULAR | Status: AC
  Filled 2023-11-18: qty 1

## 2023-11-18 MED ORDER — ACETAMINOPHEN 10 MG/ML IV SOLN
1000.0000 mg | Freq: Once | INTRAVENOUS | Status: DC | PRN
  Administered 2023-11-18: 1000 mg via INTRAVENOUS

## 2023-11-18 MED ORDER — MIDAZOLAM HCL 2 MG/2ML IJ SOLN
INTRAMUSCULAR | Status: AC
  Filled 2023-11-18: qty 2

## 2023-11-18 MED ORDER — ROCURONIUM BROMIDE 10 MG/ML (PF) SYRINGE
PREFILLED_SYRINGE | INTRAVENOUS | Status: DC | PRN
  Administered 2023-11-18: 70 mg via INTRAVENOUS

## 2023-11-18 MED ORDER — SUCCINYLCHOLINE CHLORIDE 200 MG/10ML IV SOSY
PREFILLED_SYRINGE | INTRAVENOUS | Status: DC | PRN
  Administered 2023-11-18: 200 mg via INTRAVENOUS

## 2023-11-18 MED ORDER — BUPIVACAINE HCL (PF) 0.5 % IJ SOLN
INTRAMUSCULAR | Status: DC | PRN
  Administered 2023-11-18: 10 mL via PERINEURAL

## 2023-11-18 MED ORDER — MIDAZOLAM HCL 5 MG/5ML IJ SOLN
INTRAMUSCULAR | Status: DC | PRN
  Administered 2023-11-18: 2 mg via INTRAVENOUS

## 2023-11-18 MED ORDER — LIDOCAINE HCL (PF) 2 % IJ SOLN
INTRAMUSCULAR | Status: AC
  Filled 2023-11-18: qty 10

## 2023-11-18 MED ORDER — DEXAMETHASONE SODIUM PHOSPHATE 10 MG/ML IJ SOLN
INTRAMUSCULAR | Status: AC
  Filled 2023-11-18: qty 1

## 2023-11-18 MED ORDER — BUPIVACAINE LIPOSOME 1.3 % IJ SUSP
INTRAMUSCULAR | Status: DC | PRN
  Administered 2023-11-18: 10 mL via PERINEURAL

## 2023-11-18 MED ORDER — PHENYLEPHRINE 80 MCG/ML (10ML) SYRINGE FOR IV PUSH (FOR BLOOD PRESSURE SUPPORT)
PREFILLED_SYRINGE | INTRAVENOUS | Status: AC
  Filled 2023-11-18: qty 10

## 2023-11-18 MED ORDER — ONDANSETRON HCL 4 MG/2ML IJ SOLN
INTRAMUSCULAR | Status: DC | PRN
  Administered 2023-11-18: 4 mg via INTRAVENOUS

## 2023-11-18 MED ORDER — 0.9 % SODIUM CHLORIDE (POUR BTL) OPTIME
TOPICAL | Status: DC | PRN
  Administered 2023-11-18: 1000 mL

## 2023-11-18 MED ORDER — KETOROLAC TROMETHAMINE 30 MG/ML IJ SOLN
INTRAMUSCULAR | Status: DC | PRN
  Administered 2023-11-18: 30 mg via INTRAVENOUS

## 2023-11-18 MED ORDER — ORAL CARE MOUTH RINSE: 15.0000 mL | Freq: Once | OROMUCOSAL | Status: AC

## 2023-11-18 MED ORDER — KETOROLAC TROMETHAMINE 30 MG/ML IJ SOLN
INTRAMUSCULAR | Status: AC
  Filled 2023-11-18: qty 1

## 2023-11-18 MED ORDER — OXYCODONE HCL 5 MG PO TABS: 5.0000 mg | ORAL_TABLET | ORAL | 0 refills | Status: AC | PRN

## 2023-11-18 MED ORDER — STERILE WATER FOR IRRIGATION IR SOLN
Status: DC | PRN
  Administered 2023-11-18: 1000 mL

## 2023-11-18 MED ORDER — SUCCINYLCHOLINE CHLORIDE 200 MG/10ML IV SOSY
PREFILLED_SYRINGE | INTRAVENOUS | Status: AC
  Filled 2023-11-18: qty 10

## 2023-11-18 MED ORDER — VANCOMYCIN HCL 1000 MG IV SOLR
INTRAVENOUS | Status: DC | PRN
  Administered 2023-11-18: 1000 mg via TOPICAL

## 2023-11-18 MED ORDER — CHLORHEXIDINE GLUCONATE 0.12 % MT SOLN
15.0000 mL | Freq: Once | OROMUCOSAL | Status: AC
  Administered 2023-11-18: 15 mL via OROMUCOSAL

## 2023-11-18 MED ORDER — FENTANYL CITRATE PF 50 MCG/ML IJ SOSY
25.0000 ug | PREFILLED_SYRINGE | INTRAMUSCULAR | Status: DC | PRN
  Administered 2023-11-18: 50 ug via INTRAVENOUS

## 2023-11-18 MED ORDER — SUGAMMADEX SODIUM 200 MG/2ML IV SOLN
INTRAVENOUS | Status: DC | PRN
  Administered 2023-11-18: 200 mg via INTRAVENOUS

## 2023-11-18 MED ORDER — FENTANYL CITRATE (PF) 100 MCG/2ML IJ SOLN
INTRAMUSCULAR | Status: DC | PRN
  Administered 2023-11-18: 50 ug via INTRAVENOUS
  Administered 2023-11-18: 100 ug via INTRAVENOUS
  Administered 2023-11-18: 50 ug via INTRAVENOUS
  Administered 2023-11-18: 100 ug via INTRAVENOUS

## 2023-11-18 MED ORDER — TRANEXAMIC ACID-NACL 1000-0.7 MG/100ML-% IV SOLN
1000.0000 mg | INTRAVENOUS | Status: AC
  Administered 2023-11-18: 1000 mg via INTRAVENOUS
  Filled 2023-11-18: qty 100

## 2023-11-18 SURGICAL SUPPLY — 39 items
BAG COUNTER SPONGE SURGICOUNT (BAG) IMPLANT
BIT DRILL 5/64X5 DISP (BIT) IMPLANT
BLADE SAG 18X100X1.27 (BLADE) ×1 IMPLANT
COVER SURGICAL LIGHT HANDLE (MISCELLANEOUS) ×1 IMPLANT
DRAPE INCISE IOBAN 66X45 STRL (DRAPES) IMPLANT
DRAPE SURG ORHT 6 SPLT 77X108 (DRAPES) ×2 IMPLANT
DRAPE U-SHAPE 47X51 STRL (DRAPES) ×1 IMPLANT
DRSG ADAPTIC 3X8 NADH LF (GAUZE/BANDAGES/DRESSINGS) ×1 IMPLANT
DRSG AQUACEL AG ADV 3.5X10 (GAUZE/BANDAGES/DRESSINGS) IMPLANT
DURAPREP 26ML APPLICATOR (WOUND CARE) ×1 IMPLANT
ELECT BLADE TIP CTD 4 INCH (ELECTRODE) ×1 IMPLANT
ELECTRODE REM PT RTRN 9FT ADLT (ELECTROSURGICAL) ×1 IMPLANT
GAUZE PAD ABD 8X10 STRL (GAUZE/BANDAGES/DRESSINGS) ×1 IMPLANT
GAUZE SPONGE 4X4 12PLY STRL (GAUZE/BANDAGES/DRESSINGS) ×1 IMPLANT
GLOVE BIO SURGEON STRL SZ7.5 (GLOVE) ×2 IMPLANT
GLOVE BIOGEL PI IND STRL 8 (GLOVE) ×2 IMPLANT
GOWN STRL REUS W/ TWL LRG LVL3 (GOWN DISPOSABLE) IMPLANT
GOWN STRL REUS W/ TWL XL LVL3 (GOWN DISPOSABLE) ×2 IMPLANT
HEAD HUM HIGH OFFSET 4 (Orthopedic Implant) IMPLANT
KIT BASIN OR (CUSTOM PROCEDURE TRAY) ×1 IMPLANT
KIT TURNOVER KIT B (KITS) ×1 IMPLANT
MANIFOLD NEPTUNE II (INSTRUMENTS) IMPLANT
NS IRRIG 1000ML POUR BTL (IV SOLUTION) ×1 IMPLANT
PACK SHOULDER (CUSTOM PROCEDURE TRAY) ×1 IMPLANT
PAD ARMBOARD POSITIONER FOAM (MISCELLANEOUS) ×2 IMPLANT
RESTRAINT HEAD UNIVERSAL NS (MISCELLANEOUS) ×1 IMPLANT
SLING ARM FOAM STRAP LRG (SOFTGOODS) IMPLANT
SPONGE T-LAP 18X18 ~~LOC~~+RFID (SPONGE) ×1 IMPLANT
SPONGE T-LAP 4X18 ~~LOC~~+RFID (SPONGE) IMPLANT
STEM HUMERAL PTC SZ1C 66 1275D (Stem) IMPLANT
STRIP CLOSURE SKIN 1/2X4 (GAUZE/BANDAGES/DRESSINGS) ×1 IMPLANT
SUCTION TUBE FRAZIER 10FR DISP (SUCTIONS) ×1 IMPLANT
SUT MNCRL AB 4-0 PS2 18 (SUTURE) ×1 IMPLANT
SUT VIC AB 2-0 CT1 TAPERPNT 27 (SUTURE) ×1 IMPLANT
SUTURE FIBERWR #2 38 T-5 BLUE (SUTURE) ×2 IMPLANT
TOWEL GREEN STERILE (TOWEL DISPOSABLE) ×1 IMPLANT
TOWER SMARTMIX MINI (MISCELLANEOUS) IMPLANT
WATER STERILE IRR 1000ML POUR (IV SOLUTION) ×1 IMPLANT
YANKAUER SUCT BULB TIP NO VENT (SUCTIONS) ×1 IMPLANT

## 2023-11-18 NOTE — Op Note (Signed)
 11/18/2023  9:35 AM  PATIENT:  Bradley Barr    PRE-OPERATIVE DIAGNOSIS: Left shoulder end-stage osteoarthritis  POST-OPERATIVE DIAGNOSIS:  Same  PROCEDURE: 1.  Left shoulder hemiarthroplasty 2.  Left shoulder open long Barr biceps tenodesis  SURGEON:  Selinda Belvie Gosling, MD  PHYSICIAN ASSISTANT: Dayle Moores, PA-C, present and scrubbed throughout the case, critical for completion in a timely fashion, and for retraction, instrumentation, and closure.  ANESTHESIA:   General with an interscalene block  ESTIMATED BLOOD LOSS: 50 cc  UNIQUE ASPECTS OF THE CASE: Advanced glenohumeral osteoarthritis with posterior subluxation of the humeral Barr but no biconcavity on the glenoid.  Rotator cuff tendons intact x 4.  Long Barr biceps intact as well.  PREOPERATIVE INDICATIONS:  Bradley Barr is a  43 y.o. male who failed conservative measures and elected for surgical management.    The risks benefits and alternatives were discussed with the patient preoperatively including but not limited to the risks of infection, bleeding, nerve injury, cardiopulmonary complications, the need for revision surgery, dislocation, loosening, incomplete relief of pain, among others, and the patient was willing to proceed.   OPERATIVE IMPLANTS: Tornier size 1C humeral stem 137.5 neck-shaft angle with a Stryker pirate carbon humeral Barr 46  OPERATIVE FINDINGS: Advanced glenohumeral osteoarthritis involving the glenoid and the humeral Barr with substantial osteophyte formation inferiorly.   OPERATIVE PROCEDURE: The patient was brought to the operating room and placed in the supine position. General anesthesia was administered. IV antibiotics were given.  The upper extremity was prepped and draped in usual sterile fashion. The patient was in a beachchair position with all bony prominences padded.   Time out was performed and a deltopectoral approach was carried out. The biceps tendon was  tenodesed to the pectoralis tendon. The subscapularis was released, tagging it with a #2 FiberWire, leaving a cuff of tendon for repair.    The inferior osteophyte was removed, and release of the capsule off of the humeral side was completed. The Barr was dislocated, and I reamed sequentially. I placed the humeral cutting guide at 30 of retroversion, and then pinned this into place, and made my humeral neck cut. This was at the appropriate level.   I then placed deep retractors and exposed the glenoid.  We completed the biceps tenotomy at this point in time off the superior labrum.  We noted that there was no biconcavity to the glenoid side.  Thus we did not have to ream the high side or perform any osteoplasty.  We left the posterior anterior labrum intact.    I sequentially broached, up to the selected size, with the broach set at 30 of retroversion. I placed 3 #2 Fiberwire through the bone for subsequent repair.  I then placed the real stem. I trialed with multiple heads, and the above-named component was selected. Increased posterior coverage improved the coverage. The soft tissue tension was appropriate.   I then impacted the real humeral Barr into place, reduced the Barr, and irrigated copiously. Excellent stability and range of motion was achieved. I repaired the subscapularis with a total of 4 #2 FiberWire; one for the interval, one for the corner, and then the remaining three from the lesser tuberosity which had already been passed.  Excellent repair achieved and I irrigated copiously once more.  We placed 1 g of vancomycin  powder into the deltopectoral interval the subcutaneous tissue was closed with Vicryl including the deltopectoral fascia.   The skin was closed with Steri-Strips and sterile gauze  was applied. Bradley Barr had a preoperative nerve block. Bradley Barr tolerated the procedure well and there were no complications.    Disposition:  Bradley Barr will be discharged home today from PACU.  Bradley Barr will be  in a sling for approximately 5 weeks on the total shoulder replacement protocol to protect the subscapularis repair.  Will see him back in the office in 2 weeks with 2 x-ray views of the left shoulder AP and scapular Y.

## 2023-11-18 NOTE — Anesthesia Procedure Notes (Signed)
 Procedure Name: Intubation Date/Time: 11/18/2023 7:35 AM  Performed by: Judythe Tanda Aran, CRNAPre-anesthesia Checklist: Patient identified, Emergency Drugs available, Suction available and Patient being monitored Patient Re-evaluated:Patient Re-evaluated prior to induction Oxygen  Delivery Method: Circle system utilized Preoxygenation: Pre-oxygenation with 100% oxygen  Induction Type: IV induction Ventilation: Mask ventilation without difficulty Laryngoscope Size: 2 and Miller Grade View: Grade I Tube type: Oral Tube size: 7.5 mm Number of attempts: 1 Airway Equipment and Method: Stylet Placement Confirmation: ETT inserted through vocal cords under direct vision, positive ETCO2 and breath sounds checked- equal and bilateral Secured at: 23 cm Tube secured with: Tape Dental Injury: Teeth and Oropharynx as per pre-operative assessment

## 2023-11-18 NOTE — H&P (Signed)
 ORTHOPAEDIC H and P  REQUESTING PHYSICIAN: Sharl Selinda Dover, MD  PCP:  Wendolyn Jenkins Jansky, MD  Chief Complaint: Left shoulder OA  HPI: Bradley Barr is a 43 y.o. 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  Here today for hemi-arthroplasty for treatment.  No new complaints.  Past Medical History:  Diagnosis Date   Allergy    Anxiety    Depression    GERD (gastroesophageal reflux disease)    Joint pain    Kidney stones    Pancreatitis    Pancreatitis 2016   Pneumonia    Rheumatoid arthritis (HCC)    Past Surgical History:  Procedure Laterality Date   ANKLE SURGERY  2021   achilles R-2, 1 on L   APPENDECTOMY     TONSILLECTOMY     Social History   Socioeconomic History   Marital status: Divorced    Spouse name: Not on file   Number of children: 0   Years of education: Not on file   Highest education level: Not on file  Occupational History   Not on file  Tobacco Use   Smoking status: Former    Current packs/day: 0.00    Types: Cigarettes    Quit date: 03/03/2021    Years since quitting: 2.7   Smokeless tobacco: Never  Vaping Use   Vaping status: Former   Substances: Nicotine, Flavoring  Substance and Sexual Activity   Alcohol use: Yes    Alcohol/week: 3.0 standard drinks of alcohol    Types: 3 Standard drinks or equivalent per week    Comment: occasionally-about q 2wk   Drug use: Yes    Frequency: 7.0 times per week    Types: Marijuana    Comment: last used 2 years ago   Sexual activity: Yes    Birth control/protection: Condom  Other Topics Concern   Not on file  Social History Narrative   Disabled-ortho and moods   Social Drivers of Health   Financial Resource Strain: Low Risk  (11/01/2023)   Overall Financial Resource Strain (CARDIA)    Difficulty of Paying Living Expenses: Not hard at all  Food Insecurity: No Food Insecurity (11/01/2023)   Hunger Vital Sign    Worried About Running Out  of Food in the Last Year: Never true    Ran Out of Food in the Last Year: Never true  Transportation Needs: No Transportation Needs (11/01/2023)   PRAPARE - Administrator, Civil Service (Medical): No    Lack of Transportation (Non-Medical): No  Physical Activity: Inactive (11/01/2023)   Exercise Vital Sign    Days of Exercise per Week: 0 days    Minutes of Exercise per Session: 0 min  Stress: No Stress Concern Present (11/01/2023)   [employer] of Occupational Health - Occupational Stress Questionnaire    Feeling of Stress: Not at all  Social Connections: Moderately Isolated (11/01/2023)   Social Connection and Isolation Panel    Frequency of Communication with Friends and Family: More than three times a week    Frequency of Social Gatherings with Friends and Family: Once a week    Attends Religious Services: 1 to 4 times per year    Active Member of [employer] or Organizations: No    Attends Banker Meetings: Never    Marital Status: Divorced   Family History  Problem Relation Age of Onset   Hypertension Mother    Arthritis  Mother    Asthma Mother    Depression Mother    COPD Mother    Thyroid disease Father    Hypertension Father    Hyperthyroidism Father    Arthritis Maternal Grandmother    Asthma Maternal Grandmother    COPD Maternal Grandmother    Heart disease Maternal Grandmother    Hearing loss Maternal Grandmother    Hyperlipidemia Maternal Grandmother    Stroke Maternal Grandmother    Cancer Maternal Grandmother    Hearing loss Maternal Grandfather    Heart disease Maternal Grandfather    Hyperlipidemia Maternal Grandfather    Diabetes Maternal Grandfather    ADD / ADHD Maternal Grandfather    Alcohol abuse Maternal Grandfather    Cancer Maternal Grandfather    Stroke Paternal Grandmother    Heart disease Paternal Grandfather 30 - 79   Early death Paternal Grandfather    No Known Allergies Prior to Admission medications   Medication Sig  Start Date End Date Taking? Authorizing Provider  ANASTROZOLE PO Take 0.25 mg by mouth daily.   Yes [provider]  PRESCRIPTION MEDICATION Take 25 mg by mouth every Monday, Wednesday, and Friday. Enclomiphene   Yes [provider]  tadalafil  (CIALIS ) 20 MG tablet Take 0.5-1 tablets (10-20 mg total) by mouth every other day as needed for erectile dysfunction. 09/05/23  Yes Wendolyn Jenkins Jansky, MD  TESTOSTERONE IM Inject 1 mL into the muscle every Sunday.   Yes [provider]   No results found.  Positive ROS: All other systems have been reviewed and were otherwise negative with the exception of those mentioned in the HPI and as above.  Physical Exam: General: Alert, no acute distress Cardiovascular: No pedal edema Respiratory: No cyanosis, no use of accessory musculature GI: No organomegaly, abdomen is soft and non-tender Skin: No lesions in the area of chief complaint Neurologic: Sensation intact distally Psychiatric: Patient is competent for consent with normal mood and affect Lymphatic: No axillary or cervical lymphadenopathy  MUSCULOSKELETAL: LUE-- wwp, nve  Assessment: End stage OA left shoulder  Plan: -  to OR today for left hemi-arthroplasty for end stage OA in this young male with some glenoid deformity.  - The risks, benefits, and alternatives were discussed with the patient. There are risks associated with the surgery including, but not limited to, problems with anesthesia (death), infection, fracture of bones, loosening or failure of implants, malunion, nonunion, hematoma (blood accumulation) which may require surgical drainage, blood clots, pulmonary embolism, nerve injury (foot drop), and blood vessel injury. The patient understands these risks and elects to proceed.   - dc home post op    Selinda Belvie Gosling, MD Cell [phone]    11/18/2023 6:31 AM

## 2023-11-18 NOTE — Progress Notes (Signed)
 Hemi shoulder

## 2023-11-18 NOTE — Anesthesia Procedure Notes (Signed)
 Anesthesia Regional Block: Interscalene brachial plexus block   Pre-Anesthetic Checklist: , timeout performed,  Correct Patient, Correct Site, Correct Laterality,  Correct Procedure, Correct Position, site marked,  Risks and benefits discussed,  Surgical consent,  Pre-op evaluation,  At surgeon's request and post-op pain management  Laterality: Left  Prep: Dura Prep       Needles:  Injection technique: Single-shot  Needle Type: Echogenic Stimulator Needle     Needle Length: 5cm  Needle Gauge: 20     Additional Needles:   Procedures:,,,, ultrasound used (permanent image in chart),,    Narrative:  Start time: 11/18/2023 7:08 AM End time: 11/18/2023 7:11 AM Injection made incrementally with aspirations every 5 mL.  Performed by: Personally  Anesthesiologist: Dorethea Cordella SQUIBB, DO  Additional Notes: Patient identified. Risks/Benefits/Options discussed with patient including but not limited to bleeding, infection, nerve damage, failed block, incomplete pain control. Patient expressed understanding and wished to proceed. All questions were answered. Sterile technique was used throughout the entire procedure. Please see nursing notes for vital signs. Aspirated in 5cc intervals with injection for negative confirmation. Patient was given instructions on fall risk and not to get out of bed. All questions and concerns addressed with instructions to call with any issues or inadequate analgesia.

## 2023-11-18 NOTE — Transfer of Care (Signed)
 Immediate Anesthesia Transfer of Care Note  Patient: Bradley Barr  Procedure(s) Performed: HEMIARTHROPLASTY, SHOULDER (Left: Shoulder)  Patient Location: PACU  Anesthesia Type:GA combined with regional for post-op pain  Level of Consciousness: drowsy  Airway & Oxygen  Therapy: Patient Spontanous Breathing and Patient connected to face mask  Post-op Assessment: Report given to RN and Post -op Vital signs reviewed and stable  Post vital signs: Reviewed and stable  Last Vitals:  Vitals Value Taken Time  BP 125/60 11/18/23 09:30  Temp    Pulse 86 11/18/23 09:34  Resp 14 11/18/23 09:34  SpO2 98 % 11/18/23 09:34  Vitals shown include unfiled device data.  Last Pain:  Vitals:   11/18/23 0553  TempSrc:   PainSc: 0-No pain         Complications: No notable events documented.

## 2023-11-18 NOTE — Discharge Instructions (Signed)
 Orthopedic surgery discharge instructions:  -Maintain postoperative bandages until follow up. You may begin showering in 3 days .  Please do not submerge underwater.  -Maintain your arm in sling at all times.  You should only remove for showering and getting dressed.  No lifting with the operative arm.  -For mild to moderate pain use Tylenol  and Advil  in alternating fashion around-the-clock.  For breakthrough pain use oxycodone  as necessary.  -Please apply ice to the right shoulder for 20-30 minutes out of each hour that you are awake.  Do this around-the-clock for the first 3 days from surgery.  -Follow-up in 2 weeks for routine postoperative check.

## 2023-11-18 NOTE — Anesthesia Postprocedure Evaluation (Signed)
 Anesthesia Post Note  Patient: Bradley Barr  Procedure(s) Performed: HEMIARTHROPLASTY, SHOULDER (Left: Shoulder)     Patient location during evaluation: PACU Anesthesia Type: Regional and General Level of consciousness: awake and alert Pain management: pain level controlled Vital Signs Assessment: post-procedure vital signs reviewed and stable Respiratory status: spontaneous breathing, nonlabored ventilation, respiratory function stable and patient connected to nasal cannula oxygen  Cardiovascular status: blood pressure returned to baseline and stable Postop Assessment: no apparent nausea or vomiting Anesthetic complications: no   No notable events documented.  Last Vitals:  Vitals:   11/18/23 1045 11/18/23 1051  BP: 118/73 132/79  Pulse: 85 81  Resp: 16 17  Temp: 36.4 C 37.1 C  SpO2: 94% 95%    Last Pain:  Vitals:   11/18/23 1051  TempSrc:   PainSc: 4                  Filbert Craze P Rozella Servello

## 2023-11-18 NOTE — Brief Op Note (Signed)
 11/18/2023  9:29 AM  PATIENT:  Bradley JULIANNA Barr  43 y.o. male  PRE-OPERATIVE DIAGNOSIS:  Left shoulder osteoarthrtis  POST-OPERATIVE DIAGNOSIS:  * No post-op diagnosis entered *  PROCEDURE:  Procedure(s): HEMIARTHROPLASTY, SHOULDER (Left)  SURGEON:  Surgeons and Role:    * Sharl Selinda Dover, MD - Primary  PHYSICIAN ASSISTANT:   ASSISTANTS: Kevan McClung, PA-C    EBL:  50 mL   BLOOD ADMINISTERED:none  DRAINS: none   LOCAL MEDICATIONS USED:  NONE  SPECIMEN:  No Specimen  DISPOSITION OF SPECIMEN:  N/A  COUNTS:  YES  TOURNIQUET:  * No tourniquets in log *  DICTATION: .Note written in EPIC  PLAN OF CARE: Discharge to home after PACU  PATIENT DISPOSITION:  PACU - hemodynamically stable.   Delay start of Pharmacological VTE agent (>24hrs) due to surgical blood loss or risk of bleeding: not applicable

## 2023-11-18 NOTE — Anesthesia Preprocedure Evaluation (Signed)
 Anesthesia Evaluation  Patient identified by MRN, date of birth, ID band Patient awake    Reviewed: Allergy & Precautions, NPO status , Patient's Chart, lab work & pertinent test results  Airway Mallampati: II  TM Distance: >3 FB Neck ROM: Full    Dental no notable dental hx.    Pulmonary neg pulmonary ROS, former smoker   Pulmonary exam normal        Cardiovascular negative cardio ROS  Rhythm:Regular Rate:Normal     Neuro/Psych   Anxiety Depression    negative neurological ROS     GI/Hepatic Neg liver ROS,GERD  ,,  Endo/Other  negative endocrine ROS    Renal/GU Renal disease  negative genitourinary   Musculoskeletal  (+) Arthritis , Rheumatoid disorders,    Abdominal Normal abdominal exam  (+)   Peds  Hematology Lab Results      Component                Value               Date                      WBC                      6.8                 11/07/2023                HGB                      16.1                11/07/2023                HCT                      48.9                11/07/2023                MCV                      91.2                11/07/2023                PLT                      169                 11/07/2023              Anesthesia Other Findings   Reproductive/Obstetrics                              Anesthesia Physical Anesthesia Plan  ASA: 2  Anesthesia Plan: General and Regional   Post-op Pain Management: Regional block*   Induction: Intravenous  PONV Risk Score and Plan: 2 and Ondansetron , Dexamethasone , Midazolam  and Treatment may vary due to age or medical condition  Airway Management Planned: Mask and Oral ETT  Additional Equipment: None  Intra-op Plan:   Post-operative Plan: Extubation in OR  Informed Consent: I have reviewed the patients History and Physical, chart, labs and discussed the procedure including the risks, benefits and  alternatives for  the proposed anesthesia with the patient or authorized representative who has indicated his/her understanding and acceptance.     Dental advisory given  Plan Discussed with: CRNA  Anesthesia Plan Comments:         Anesthesia Quick Evaluation

## 2023-11-21 ENCOUNTER — Encounter (HOSPITAL_COMMUNITY): Payer: Self-pay | Admitting: Orthopedic Surgery

## 2023-12-01 DIAGNOSIS — Z4789 Encounter for other orthopedic aftercare: Secondary | ICD-10-CM | POA: Diagnosis not present

## 2023-12-08 ENCOUNTER — Encounter: Payer: Self-pay | Admitting: Family Medicine

## 2023-12-08 ENCOUNTER — Ambulatory Visit (INDEPENDENT_AMBULATORY_CARE_PROVIDER_SITE_OTHER): Admitting: Family Medicine

## 2023-12-08 VITALS — BP 120/80 | HR 73 | Temp 98.2°F | Ht 69.0 in | Wt 241.0 lb

## 2023-12-08 DIAGNOSIS — Z6835 Body mass index (BMI) 35.0-35.9, adult: Secondary | ICD-10-CM

## 2023-12-08 DIAGNOSIS — M130 Polyarthritis, unspecified: Secondary | ICD-10-CM

## 2023-12-08 DIAGNOSIS — K760 Fatty (change of) liver, not elsewhere classified: Secondary | ICD-10-CM | POA: Diagnosis not present

## 2023-12-08 DIAGNOSIS — E66812 Obesity, class 2: Secondary | ICD-10-CM | POA: Diagnosis not present

## 2023-12-08 DIAGNOSIS — R4589 Other symptoms and signs involving emotional state: Secondary | ICD-10-CM | POA: Diagnosis not present

## 2023-12-08 MED ORDER — BUPROPION HCL ER (SR) 100 MG PO TB12: 100.0000 mg | ORAL_TABLET | Freq: Two times a day (BID) | ORAL | 0 refills | Status: DC

## 2023-12-08 NOTE — Progress Notes (Signed)
 Office: [phone]  /  Fax: [phone]  WEIGHT SUMMARY AND BIOMETRICS  Starting Date: 10/13/23  Starting Weight: 245lb   Weight Lost Since Last Visit: 3lb   Vitals Temp: 98.2 F (36.8 C) BP: 120/80 Pulse Rate: 73 SpO2: 99 %   Body Composition  Body Fat %: 28.4 % Fat Mass (lbs): 68.6 lbs Muscle Mass (lbs): 164.2 lbs Total Body Water  (lbs): 123 lbs Visceral Fat Rating : 14     HPI  Chief Complaint: OBESITY  Bradley Barr is here to discuss his progress with his obesity treatment plan. He is on the the Category 3 Plan and states he is following his eating plan approximately 30-40 % of the time. He states he is exercising 30 minutes 1-2 times per week.  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 Body mass index is 35.59 kg/m.  General: He is overweight, cooperative, alert, well developed, and in no acute distress. PSYCH: Has normal mood, affect and thought process.   Lungs: Normal breathing effort, no conversational dyspnea. Wearing L arm sling  ASSESSMENT AND PLAN  TREATMENT PLAN FOR OBESITY:  Recommended Dietary Goals  [name] is currently in the action stage of  change. As such, his goal is to continue weight management plan. He has agreed to the Category 3 Plan and practicing portion control and making smarter food choices, such as increasing vegetables and decreasing simple carbohydrates. Dining out guide given  Behavioral Intervention  We discussed the following Behavioral Modification Strategies today: increasing lean protein intake to established goals, increasing fiber rich foods, avoiding skipping meals, work on meal planning and preparation, keeping healthy foods at home, identifying sources and decreasing liquid calories, avoiding temptations and identifying enticing environmental cues, and planning for success.  Additional resources provided today: NA  Recommended Physical Activity Goals  Jacquis has been advised to work up to 150 minutes of moderate intensity aerobic activity a week and strengthening exercises 2-3 times per week for cardiovascular health, weight loss maintenance and preservation of muscle mass.   He has agreed to Think about enjoyable ways to increase daily physical activity and overcoming barriers to exercise and Increase physical activity in their day and reduce sedentary time (increase NEAT). (( Walking limited due to R knee pain )) Getting ready to start PT for L shoulder   Pharmacotherapy changes for the treatment of obesity: none  ASSOCIATED CONDITIONS ADDRESSED TODAY  Depressed mood Chronic arthritis pain, stress at home and hx of anxiety and depression, untreated He agrees to starting Wellbutrin SR 100 mg bid to help with emotional eating while working on improving mental health, self care,  sleep and nutrition.  -     buPROPion HCl ER (SR); Take 1 tablet (100 mg total) by mouth 2 (two) times daily.  Dispense: 60 tablet; Refill: 0  Obesity, Class II, BMI 35-39.9  BMI 35.0-35.9,adult  Fatty liver disease, nonalcoholic  Fibrosis 4 Score = .8 (Low risk)        Interpretation for patients with NAFLD           <1.30       -  F0-F1 (Low risk)          1.30-2.67 -  Indeterminate           >2.67      -  F3-F4 (High risk)     Validated for ages 82-65        Polyarthritis of multiple sites F/u with ortho following L shoulder hemiarthroplasy, planning to start PT Will plan for R knee arthroplasty soon, seeing Dr Arnaldo Taking Oxycodone  5 mg q 4 hr prn      He was informed of the importance of frequent follow up visits to maximize his success with intensive lifestyle modifications for his multiple health conditions.   ATTESTASTION STATEMENTS:  Reviewed by clinician on day of visit: allergies, medications, problem list, medical history, surgical history, family history, social history, and previous encounter notes pertinent to obesity diagnosis.   I have personally spent 30 minutes total time today in preparation, patient care, nutritional counseling and education,  and documentation for this visit, including the following: review of most recent clinical lab tests, prescribing medications/ refilling medications, reviewing medical assistant documentation, review and interpretation of bioimpedence results.     Darice Haddock, D.O. DABFM, DABOM [facility] [address] [place], [place] [address] [phone]

## 2023-12-08 NOTE — Patient Instructions (Signed)
 Start Wellbutrin SR 100 mg with breakfast and dinner time  Remember that you can add a fresh fruit serving to the morning (either with breakfast or mid morning)  Work on improving food choices when eating out for lunch Avoid high calorie options Don't wait until you are starving Stay off the high sugar drinks  Continue to work on meal planning for dinner Stock up on some easy healthy frozen dinners: [employer] Meals Real food Foods Meals Lean Cusine Protein Healthy Choice meals  Limit snacks after dinner to 200 calories

## 2023-12-26 ENCOUNTER — Ambulatory Visit: Payer: Self-pay

## 2023-12-26 NOTE — Telephone Encounter (Signed)
 Appt tomorrow

## 2023-12-26 NOTE — Telephone Encounter (Signed)
 FYI Only or Action Required?: FYI only for provider: appointment scheduled on 12/27/2023 at 10:20 AM.  Reports eye irritation and partial eyelid swelling to right eye. Patient reports symptoms have been going on for a week.   Patient was last seen in primary care on 12/08/2023 by Waylan Darice BRAVO, DO.  Called Nurse Triage reporting Eye Problem.  Symptoms began a week ago.  Interventions attempted: Rest, hydration, or home remedies.  Symptoms are: unchanged.  Triage Disposition: See Physician Within 24 Hours  Patient/caregiver understands and will follow disposition?: Yes  Copied from CRM [id number]. Topic: Clinical - Red Word Triage >> Dec 26, 2023  1:12 PM Alfonso ORN wrote: Red Word that prompted transfer to Nurse Triage: symptoms: rash on eyelids, burning/pain and a lot of irritation Reason for Disposition  Eye pain present > 24 hours  Answer Assessment - Initial Assessment Questions 1. LOCATION: Where is the redness? (e.g., eyeball or outer eyelids) Note: When callers say the eye is red, they usually mean the sclera (white of the eye) is red.       From the corner of eye to middle of eyelid 2. ONE OR BOTH EYES: Is the redness in one or both eyes?      Right eye 3. ONSET: When did the redness start? (e.g., hours, days)      Started last week 4. EYELIDS: Are the eyelids red or swollen? If Yes, ask: How much?      Yes the right eyelid is red and puffy 5. VISION: Do you have blurred vision?     no 6. ITCHING: Does it feel itchy? If so ask: How bad is it (Scale 1-10; or mild, moderate, severe)     yes 7. PAIN: Is it painful? If Yes, ask: How bad is the pain? (Scale 0-10; or none, mild, moderate, severe)     moderate 8. CONTACT LENS: Do you wear contacts?     no 9. CAUSE: What do you think is causing the redness?     unsure 10. OTHER SYMPTOMS: Do you have any other symptoms? (e.g., fever, runny nose, cough, vomiting)       no  Protocols used: Eye -  Redness-A-AH

## 2023-12-27 ENCOUNTER — Ambulatory Visit (INDEPENDENT_AMBULATORY_CARE_PROVIDER_SITE_OTHER): Admitting: Physician Assistant

## 2023-12-27 ENCOUNTER — Encounter: Payer: Self-pay | Admitting: Physician Assistant

## 2023-12-27 VITALS — BP 130/80 | HR 74 | Temp 98.1°F | Ht 69.0 in | Wt 247.2 lb

## 2023-12-27 DIAGNOSIS — R21 Rash and other nonspecific skin eruption: Secondary | ICD-10-CM

## 2023-12-27 MED ORDER — KETOCONAZOLE 2 % EX CREA: 1.0000 | TOPICAL_CREAM | Freq: Every day | CUTANEOUS | 0 refills | Status: DC

## 2023-12-27 MED ORDER — VALACYCLOVIR HCL 1 G PO TABS: 1000.0000 mg | ORAL_TABLET | Freq: Three times a day (TID) | ORAL | 0 refills | Status: AC

## 2023-12-27 NOTE — Patient Instructions (Signed)
 It was great to see you!  Start valtrex for possible shingles -- out of an abundance of caution If you develop blisters, please let me know and we will also consider referring your to eye doctor to make sure your vision is okay  Trial topical ketoconazole (sent in) to the area -- 1-2 times daily If no improvement, add OTC (available over the counter without a prescription) hydrocortisone cream to the ketoconazole (put a bit of both in the palm of your hand, mix together and then apply to the area)  Let me know if no improvement or any changes  Take care,  Lucie Buttner PA-C

## 2023-12-27 NOTE — Progress Notes (Signed)
 Bradley Barr is a 43 y.o. male here for a follow up of a pre-existing problem.  History of Present Illness:   Chief Complaint  Patient presents with   Rash    Pt c/o rash on right lower eyelid and under eye, itching. Has tried OTC lotion for the dryness no relief, started a week ago.    Discussed the use of AI scribe software for clinical note transcription with the patient, who gave verbal consent to proceed.  History of Present Illness   Bradley Barr is a 43 year old male who presents with a rash and bump on his eye.  Last week, he noticed a rash on his eye that developed into a bump with burning and itching sensations, especially when rubbed. He has applied a face and beard moisturizer and a fungal ointment previously used for his child's ringworm, but neither provided relief. There are no changes in vision, crusting upon waking, or significant tenderness. The rash is dry and flaky. He denies flu-like symptoms, cold sores, blisters, or open sores. He has dry skin, particularly around his face, which he manages with moisturizers and oils. He initially suspected poison ivy due to time spent in the woods, but symptoms have persisted beyond a week without spreading. The fungal cream used was over-the-counter.        Past Medical History:  Diagnosis Date   Allergy    Anxiety    Depression    GERD (gastroesophageal reflux disease)    Joint pain    Kidney stones    Pancreatitis    Pancreatitis 2016   Pneumonia    Rheumatoid arthritis (HCC)      Social History   Tobacco Use   Smoking status: Former    Current packs/day: 0.00    Types: Cigarettes    Quit date: 03/03/2021    Years since quitting: 2.8   Smokeless tobacco: Never  Vaping Use   Vaping status: Former   Substances: Nicotine, Flavoring  Substance Use Topics   Alcohol use: Yes    Alcohol/week: 3.0 standard drinks of alcohol    Types: 3 Standard drinks or equivalent per week    Comment:  occasionally-about q 2wk   Drug use: Yes    Frequency: 7.0 times per week    Types: Marijuana    Comment: last used 2 years ago    Past Surgical History:  Procedure Laterality Date   ANKLE SURGERY  2021   achilles R-2, 1 on L   APPENDECTOMY     SHOULDER HEMI-ARTHROPLASTY Left 11/18/2023   Procedure: HEMIARTHROPLASTY, SHOULDER;  Surgeon: Sharl Selinda Dover, MD;  Location: [facility];  Service: Orthopedics;  Laterality: Left;   TONSILLECTOMY      Family History  Problem Relation Age of Onset   Hypertension Mother    Arthritis Mother    Asthma Mother    Depression Mother    COPD Mother    Thyroid disease Father    Hypertension Father    Hyperthyroidism Father    Arthritis Maternal Grandmother    Asthma Maternal Grandmother    COPD Maternal Grandmother    Heart disease Maternal Grandmother    Hearing loss Maternal Grandmother    Hyperlipidemia Maternal Grandmother    Stroke Maternal Grandmother    Cancer Maternal Grandmother    Hearing loss Maternal Grandfather    Heart disease Maternal Grandfather    Hyperlipidemia Maternal Grandfather    Diabetes Maternal Grandfather    ADD / ADHD Maternal Grandfather  Alcohol abuse Maternal Grandfather    Cancer Maternal Grandfather    Stroke Paternal Grandmother    Heart disease Paternal Grandfather 37 - 33   Early death Paternal Grandfather     No Known Allergies  Current Medications:   Current Outpatient Medications:    ANASTROZOLE PO, Take 0.25 mg by mouth daily., Disp: , Rfl:    ibuprofen  (ADVIL ) 800 MG tablet, Take 800 mg by mouth 3 (three) times daily as needed., Disp: , Rfl:    methocarbamol (ROBAXIN) 750 MG tablet, Take 750 mg by mouth 3 (three) times daily as needed., Disp: , Rfl:    oxyCODONE  (ROXICODONE ) 5 MG immediate release tablet, Take 1 tablet (5 mg total) by mouth every 4 (four) hours as needed for moderate pain (pain score 4-6) or severe pain (pain score 7-10)., Disp: 25 tablet, Rfl: 0   PRESCRIPTION  MEDICATION, Take 25 mg by mouth every Monday, Wednesday, and Friday. Enclomiphene, Disp: , Rfl:    tadalafil  (CIALIS ) 20 MG tablet, Take 0.5-1 tablets (10-20 mg total) by mouth every other day as needed for erectile dysfunction., Disp: 10 tablet, Rfl: 11   TESTOSTERONE IM, Inject 1 mL into the muscle every Sunday., Disp: , Rfl:    buPROPion ER (WELLBUTRIN SR) 100 MG 12 hr tablet, Take 1 tablet (100 mg total) by mouth 2 (two) times daily. (Patient not taking: Reported on 12/27/2023), Disp: 60 tablet, Rfl: 0   Review of Systems:   Negative unless otherwise specified per HPI.  Vitals:   Vitals:   12/27/23 1041  BP: 130/80  Pulse: 74  Temp: 98.1 F (36.7 C)  TempSrc: Temporal  SpO2: 98%  Weight: 247 lb 4 oz (112.2 kg)  Height: 5' 9 (1.753 m)     Body mass index is 36.51 kg/m.  Physical Exam:   Physical Exam Vitals and nursing note reviewed.  Constitutional:      Appearance: He is well-developed.  HENT:     Head: Normocephalic.  Eyes:     General: Lids are normal.     Extraocular Movements: Extraocular movements intact.     Right eye: Normal extraocular motion.     Left eye: Normal extraocular motion.     Conjunctiva/sclera: Conjunctivae normal.     Pupils: Pupils are equal, round, and reactive to light.     Comments: Erythematous patch to inner lower aspect of right eye lid Small area of erythema to right cheek with ?pustule  Pulmonary:     Effort: Pulmonary effort is normal.  Musculoskeletal:        General: Normal range of motion.     Cervical back: Normal range of motion.  Lymphadenopathy:     Cervical:     Right cervical: No superficial, deep or posterior cervical adenopathy.    Left cervical: No superficial, deep or posterior cervical adenopathy.  Skin:    General: Skin is warm and dry.  Neurological:     Mental Status: He is alert and oriented to person, place, and time.  Psychiatric:        Behavior: Behavior normal.        Thought Content: Thought content  normal.        Judgment: Judgment normal.     Assessment and Plan:   Assessment and Plan    Rash and nonspecific skin eruption Suspected herpes zoster due to unilateral rash and burning sensation on face and eye. Risk of vision loss if untreated. - Prescribed oral antiviral medication, valtrex, out of an  abundance of caution - Instructed to monitor for blisters and avoid contact if blisters open until crusted. - reports there is no vision concerns - if blisters develop, likely will refer to ophtho urgently for evaluation Will also add on treatment for possible seborrheic dermatitis - Prescribed antifungal cream. - Instructed to mix with over-the-counter hydrocortisone cream if ineffective  Lucie Buttner, PA-C

## 2023-12-28 DIAGNOSIS — M25612 Stiffness of left shoulder, not elsewhere classified: Secondary | ICD-10-CM | POA: Diagnosis not present

## 2023-12-28 DIAGNOSIS — M25512 Pain in left shoulder: Secondary | ICD-10-CM | POA: Diagnosis not present

## 2024-01-03 ENCOUNTER — Ambulatory Visit: Payer: Self-pay

## 2024-01-03 NOTE — Telephone Encounter (Signed)
 FYI Only or Action Required?: FYI only for provider: appointment scheduled on 01/05/24.  Patient was last seen in primary care on 12/27/2023 by Job Lukes, PA.  Called Nurse Triage reporting Rash.  Symptoms began several weeks ago.  Interventions attempted: Prescription medications: ketoconazole cream and valacyclovir tablet.  Symptoms are: unchanged.  Triage Disposition: See PCP When Office is Open (Within 3 Days)  Patient/caregiver understands and will follow disposition?: Yes                            Copied from CRM #[id number]. Topic: Clinical - Red Word Triage >> Jan 03, 2024 10:32 AM Carlyon D wrote: Red Word that prompted transfer to Nurse Triage: Swollen behind right ear pt states its a lump or buldge.  Rash around the right eye itchy and burning  Reason for Disposition  Localized rash present > 7 days  Answer Assessment - Initial Assessment Questions 1. APPEARANCE of RASH: What does the rash look like? (e.g., blisters, dry flaky skin, red spots, redness, sores)     Red bulge behind right ear Itching and burning, sometimes redness and dryness along right eyelid 2. LOCATION: Where is the rash located?      Behind right ear and around right eye 3. NUMBER: How many spots are there?      Denies spotting  4. SIZE: How big are the spots? (e.g., inches, cm; or compare to size of pinhead, tip of pen, eraser, pea)      Bulge behind right ear is noticeable, patient did not give a direct size when prompted 5. ONSET: When did the rash start?      Bulge around ear- several weeks Rash on right eyelid- 2 weeks, OV 11/4, states symptoms have  not really changed 6. ITCHING: Does the rash itch? If Yes, ask: How bad is the itch?  (Scale 0-10; or none, mild, moderate, severe)     Mild 7. PAIN: Does the rash hurt? If Yes, ask: How bad is the pain?  (Scale 0-10; or none, mild, moderate, severe)     Denies 8. OTHER SYMPTOMS: Do you have any  other symptoms? (e.g., fever)     Denies fever, denies difficulty breathing, denies difficulty swallowing 9. PREGNANCY: Is there any chance you are pregnant? When was your last menstrual period?     N/A  Protocols used: Rash or Redness - Localized-A-AH

## 2024-01-05 ENCOUNTER — Ambulatory Visit: Admitting: Family Medicine

## 2024-01-06 ENCOUNTER — Encounter: Payer: Self-pay | Admitting: Family Medicine

## 2024-01-06 ENCOUNTER — Other Ambulatory Visit: Payer: Self-pay

## 2024-01-06 ENCOUNTER — Ambulatory Visit: Admitting: Family Medicine

## 2024-01-06 VITALS — BP 124/80 | HR 96 | Temp 98.6°F | Ht 69.0 in | Wt 245.6 lb

## 2024-01-06 DIAGNOSIS — L729 Follicular cyst of the skin and subcutaneous tissue, unspecified: Secondary | ICD-10-CM | POA: Diagnosis not present

## 2024-01-06 DIAGNOSIS — J309 Allergic rhinitis, unspecified: Secondary | ICD-10-CM

## 2024-01-06 DIAGNOSIS — L738 Other specified follicular disorders: Secondary | ICD-10-CM

## 2024-01-06 DIAGNOSIS — L089 Local infection of the skin and subcutaneous tissue, unspecified: Secondary | ICD-10-CM

## 2024-01-06 DIAGNOSIS — H1013 Acute atopic conjunctivitis, bilateral: Secondary | ICD-10-CM | POA: Diagnosis not present

## 2024-01-06 DIAGNOSIS — L2084 Intrinsic (allergic) eczema: Secondary | ICD-10-CM | POA: Diagnosis not present

## 2024-01-06 MED ORDER — OLOPATADINE HCL 0.1 % OP SOLN: 1.0000 [drp] | Freq: Two times a day (BID) | OPHTHALMIC | 2 refills | Status: DC

## 2024-01-06 MED ORDER — CETIRIZINE HCL 10 MG PO TABS
10.0000 mg | ORAL_TABLET | Freq: Every day | ORAL | Status: AC
Start: 2024-01-06 — End: ?

## 2024-01-06 MED ORDER — DOXYCYCLINE HYCLATE 100 MG PO TABS: 100.0000 mg | ORAL_TABLET | Freq: Two times a day (BID) | ORAL | 0 refills | Status: AC

## 2024-01-06 MED ORDER — TRIAMCINOLONE ACETONIDE 0.1 % EX CREA: 1.0000 | TOPICAL_CREAM | Freq: Two times a day (BID) | CUTANEOUS | 0 refills | Status: AC
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
## 2024-01-06 NOTE — Patient Instructions (Signed)
 Please follow up if symptoms do not improve or as needed.     VISIT SUMMARY: You came in today because of a persistent rash and a bump behind your ear. We discussed your symptoms, including the rash under your eye, the bump behind your ear, dryness and itchiness of your ears, and a rash under your beard. We also talked about your eye watering during flare-ups and your recent stressors.  YOUR PLAN: -ALLERGIC CONJUNCTIVITIS AND PERIORBITAL DERMATITIS: You have an allergic reaction causing irritation and burning under your eye, likely due to environmental exposure and stress. I recommend taking Zyrtec at night for your allergy symptoms and using prescribed eye drops twice a day to help with itching and burning. Use a light steroid cream for your ears and mix it with Lubriderm or Eucerin for dryness under your eyes, but only for 1-2 days.  -ECZEMA OF EARS AND FACE: You have dry, flaky, and itchy skin on your ears and face. I have prescribed a light steroid cream for these areas. Avoid rubbing the affected areas and use Vaseline to help with dryness and to remind you not to touch the areas.  -EPIDERMAL CYST OF POSTERIOR EAR, ALMOST INFECTED: You have a bump behind your ear that is almost infected. This is likely due to stress and environmental factors. I have prescribed an antibiotic to address the potential infection.  -FOLLICULITIS OF BEARD AREA: You have redness and a rash in your beard area due to a bacterial infection of the hair follicles, possibly worsened by eczema and psoriasis. I have prescribed antibiotics to treat this and advised using a steroid cream for the beard area.  INSTRUCTIONS: Please follow up as needed if your symptoms do not improve or if they worsen. Continue with the prescribed treatments and avoid any known irritants. If you have any questions or concerns, do not hesitate to contact our office.  Inflamed Skin (Atopic Dermatitis or Eczema): What to Know Atopic dermatitis is a  skin condition that causes dry, itchy, and inflamed skin. It's the most common type of eczema, which is a group of skin conditions that make your skin feel rough and puffy. This condition often gets worse in the winter and better in the summer. Atopic dermatitis usually starts in childhood and can last into adulthood. It's not contagious, so it does not spread from person to person. Your symptoms may get worse when you're having a flare-up. During a flare-up, your symptoms may get worse and bother you. What are the causes? The exact cause of this condition isn't known. Flare-ups can be triggered by: Contact with things you're sensitive or allergic to. Stress. Some foods. Very hot or cold weather. Harsh chemicals and soaps. Dry air. Chlorine. What increases the risk? You're more likely to get this condition if you have a personal or family history of: Eczema. Allergies. Asthma. Hay fever. What are the signs or symptoms?  Dry, scaly skin. Red, brown, purple, or grayish rash. Itchiness. Thick and cracked skin over time. How is this diagnosed? This condition is diagnosed based on: Symptoms. Physical exam. Medical history. How is this treated? There's no cure for this condition, but you can manage your symptoms. Do this by: Controlling your itchiness and scratching with antihistamine medicine or steroid creams. Avoiding allergens or triggers. Managing stress. Trying light therapy, also called phototherapy if other treatments don't work or if it's all over your body. Follow these instructions at home: Skin care  Keep your skin hydrated. To do this: Use unscented  lotions that contain petroleum. Avoid lotions with alcohol or water . These can dry out your skin more. Take short baths or showers (less than 5 minutes). Use warm water  instead of hot water . Use mild, unscented soaps. Avoid bubble bath. Put lotion on right after bathing. Do not put anything on your skin without checking  with your health care provider. General instructions Take or apply your medicines only as told. Wear clothes made of cotton or cotton blends. Dress lightly to avoid itching that can be caused by heat. When doing laundry, rinse your clothes twice to remove all soap. Use soap that doesn't have dyes and perfumes. Avoid triggers that cause flare-ups. Avoid scratching. It can make the rash and itching worse and can lead to infection. Keep fingernails short to avoid scratching open the skin. Avoid people who have cold sores or fever blisters. These infections can make your condition worse. Keep all follow-up visits to make sure your treatment plan is working. Contact a health care provider if: Your itching affects your sleep. Your rash gets worse or doesn't get better after a week of treatment. You have a fever. You have a rash after being around someone with cold sores or fever blisters. You have warmth or pus in the rash area. You have soft yellow scabs in the rash area. This information is not intended to replace advice given to you by your health care provider. Make sure you discuss any questions you have with your health care provider. Document Revised: 07/13/2022 Document Reviewed: 07/13/2022 Elsevier Patient Education  2024 [employer]                      Contains text generated by [employer].                                 Contains text generated by [employer].

## 2024-01-09 ENCOUNTER — Ambulatory Visit: Admitting: Family Medicine

## 2024-01-10 DIAGNOSIS — M25512 Pain in left shoulder: Secondary | ICD-10-CM | POA: Diagnosis not present

## 2024-01-10 DIAGNOSIS — M25612 Stiffness of left shoulder, not elsewhere classified: Secondary | ICD-10-CM | POA: Diagnosis not present

## 2024-01-11 ENCOUNTER — Ambulatory Visit: Admitting: Family Medicine

## 2024-01-11 ENCOUNTER — Encounter: Payer: Self-pay | Admitting: Family Medicine

## 2024-01-11 VITALS — BP 120/78 | HR 87 | Temp 98.0°F | Ht 69.0 in | Wt 240.0 lb

## 2024-01-11 DIAGNOSIS — Z6835 Body mass index (BMI) 35.0-35.9, adult: Secondary | ICD-10-CM

## 2024-01-11 DIAGNOSIS — E66812 Obesity, class 2: Secondary | ICD-10-CM

## 2024-01-11 DIAGNOSIS — F1290 Cannabis use, unspecified, uncomplicated: Secondary | ICD-10-CM | POA: Diagnosis not present

## 2024-01-11 DIAGNOSIS — K760 Fatty (change of) liver, not elsewhere classified: Secondary | ICD-10-CM

## 2024-01-11 DIAGNOSIS — F411 Generalized anxiety disorder: Secondary | ICD-10-CM
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
## 2024-01-12 DIAGNOSIS — M25512 Pain in left shoulder: Secondary | ICD-10-CM | POA: Diagnosis not present

## 2024-01-12 DIAGNOSIS — M25612 Stiffness of left shoulder, not elsewhere classified: Secondary | ICD-10-CM | POA: Diagnosis not present

## 2024-01-15 ENCOUNTER — Other Ambulatory Visit: Payer: Self-pay

## 2024-01-15 ENCOUNTER — Emergency Department
Admission: EM | Admit: 2024-01-15 | Discharge: 2024-01-15 | Disposition: A | Discharge status: Home / Self Care | Attending: Emergency Medicine | Admitting: Emergency Medicine

## 2024-01-15 DIAGNOSIS — R748 Abnormal levels of other serum enzymes: Secondary | ICD-10-CM | POA: Insufficient documentation

## 2024-01-15 DIAGNOSIS — F10129 Alcohol abuse with intoxication, unspecified: Secondary | ICD-10-CM | POA: Diagnosis not present

## 2024-01-15 DIAGNOSIS — F10920 Alcohol use, unspecified with intoxication, uncomplicated: Secondary | ICD-10-CM | POA: Insufficient documentation

## 2024-01-15 DIAGNOSIS — Y907 Blood alcohol level of 200-239 mg/100 ml: Secondary | ICD-10-CM | POA: Diagnosis not present

## 2024-01-15 DIAGNOSIS — R110 Nausea: Secondary | ICD-10-CM | POA: Insufficient documentation

## 2024-01-15 DIAGNOSIS — R109 Unspecified abdominal pain: Secondary | ICD-10-CM | POA: Diagnosis not present

## 2024-01-15 DIAGNOSIS — R4182 Altered mental status, unspecified: Secondary | ICD-10-CM | POA: Insufficient documentation

## 2024-01-15 LAB — COMPREHENSIVE METABOLIC PANEL WITH GFR
ALT: 31 U/L (ref 0–44)
AST: 20 U/L (ref 15–41)
Albumin: 4.5 g/dL (ref 3.5–5.0)
Alkaline Phosphatase: 68 U/L (ref 38–126)
Anion gap: 15 (ref 5–15)
BUN: 17 mg/dL (ref 6–20)
CO2: 22 mmol/L (ref 22–32)
Calcium: 8.7 mg/dL — ABNORMAL LOW (ref 8.9–10.3)
Chloride: 102 mmol/L (ref 98–111)
Creatinine, Ser: 1.15 mg/dL (ref 0.61–1.24)
GFR, Estimated: >60 mL/min (ref >60)
Glucose, Bld: 100 mg/dL — ABNORMAL HIGH (ref 70–99)
Potassium: 3.8 mmol/L (ref 3.5–5.1)
Sodium: 139 mmol/L (ref 135–145)
Total Bilirubin: 0.3 mg/dL (ref 0.0–1.2)
Total Protein: 7 g/dL (ref 6.5–8.1)

## 2024-01-15 LAB — URINALYSIS, ROUTINE W REFLEX MICROSCOPIC
Bilirubin Urine: NEGATIVE
Glucose, UA: NEGATIVE mg/dL
Hgb urine dipstick: NEGATIVE
Ketones, ur: NEGATIVE mg/dL
Leukocytes,Ua: NEGATIVE
Nitrite: NEGATIVE
Protein, ur: NEGATIVE mg/dL
Specific Gravity, Urine: 1.004 — ABNORMAL LOW (ref 1.005–1.030)
pH: 5 (ref 5.0–8.0)

## 2024-01-15 LAB — ETHANOL: Alcohol, Ethyl (B): 201 mg/dL — ABNORMAL HIGH (ref <15)

## 2024-01-15 LAB — CBC
HCT: 43 % (ref 39.0–52.0)
Hemoglobin: 14.4 g/dL (ref 13.0–17.0)
MCH: 29.5 pg (ref 26.0–34.0)
MCHC: 33.5 g/dL (ref 30.0–36.0)
MCV: 88.1 fL (ref 80.0–100.0)
Platelets: 160 K/uL (ref 150–400)
RBC: 4.88 MIL/uL (ref 4.22–5.81)
RDW: 13.7 % (ref 11.5–15.5)
WBC: 8.5 K/uL (ref 4.0–10.5)
nRBC: 0 % (ref 0.0–0.2)

## 2024-01-15 LAB — URINE DRUG SCREEN
Amphetamines: NEGATIVE
Barbiturates: NEGATIVE
Benzodiazepines: NEGATIVE
Cocaine: NEGATIVE
Fentanyl: NEGATIVE
Methadone Scn, Ur: NEGATIVE
Opiates: NEGATIVE
Tetrahydrocannabinol: POSITIVE — AB

## 2024-01-15 LAB — LIPASE, BLOOD: Lipase: 232 U/L — ABNORMAL HIGH (ref 11–51)

## 2024-01-15 MED ORDER — ONDANSETRON 4 MG PO TBDP: 4.0000 mg | ORAL_TABLET | Freq: Three times a day (TID) | ORAL | 0 refills | Status: AC | PRN
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
## 2024-01-15 NOTE — ED Provider Notes (Signed)
 43 year old male who was signed out to me awaiting sobriety.  He does have a slight elevation in his lipase, apparently initial arrival he was having some abdominal pain as well.  But this has since significantly improved.  I discussed with him the findings, and discussed management as well as return precautions.  We will give him a brief course of Zofran  for home and have him discharged home at this time.   Fernand Rossie HERO, MD 01/15/24 [phone]

## 2024-01-15 NOTE — Discharge Instructions (Addendum)
 You were seen today due to concern of intoxication.  At this time fortunately your symptoms are improving, your testing for your pancreas enzyme was slightly elevated, fortunately given your symptoms, I feel this is unlikely from pancreatitis, but I have written for some nausea medication for you to take at home that should help manage her symptoms, please take this as instructed.  Please be sure to drink plenty of water  and eat a bland diet over the next few days.  Follow-up with your primary doctor.  If you have worsening of symptoms such as fevers, abdominal pain, or any other symptoms you find concerning please return to the emergency department immediately for further medical management.

## 2024-01-16 DIAGNOSIS — M25612 Stiffness of left shoulder, not elsewhere classified: Secondary | ICD-10-CM | POA: Diagnosis not present

## 2024-01-16 DIAGNOSIS — M25512 Pain in left shoulder: Secondary | ICD-10-CM | POA: Diagnosis not present

## 2024-01-18 DIAGNOSIS — M25512 Pain in left shoulder: Secondary | ICD-10-CM | POA: Diagnosis not present

## 2024-01-18 DIAGNOSIS — M25612 Stiffness of left shoulder, not elsewhere classified: Secondary | ICD-10-CM | POA: Diagnosis not present

## 2024-01-23 DIAGNOSIS — M25512 Pain in left shoulder: Secondary | ICD-10-CM | POA: Diagnosis not present

## 2024-01-23 DIAGNOSIS — M25612 Stiffness of left shoulder, not elsewhere classified: Secondary | ICD-10-CM | POA: Diagnosis not present

## 2024-01-26 DIAGNOSIS — M25612 Stiffness of left shoulder, not elsewhere classified: Secondary | ICD-10-CM | POA: Diagnosis not present

## 2024-01-26 DIAGNOSIS — M25512 Pain in left shoulder: Secondary | ICD-10-CM | POA: Diagnosis not present

## 2024-01-31 DIAGNOSIS — M25512 Pain in left shoulder: Secondary | ICD-10-CM | POA: Diagnosis not present

## 2024-01-31 DIAGNOSIS — M25612 Stiffness of left shoulder, not elsewhere classified: Secondary | ICD-10-CM | POA: Diagnosis not present

## 2024-02-01 DIAGNOSIS — M170 Bilateral primary osteoarthritis of knee: Secondary | ICD-10-CM | POA: Diagnosis not present

## 2024-02-01 DIAGNOSIS — M25521 Pain in right elbow: Secondary | ICD-10-CM | POA: Diagnosis not present

## 2024-02-01 DIAGNOSIS — M25522 Pain in left elbow: Secondary | ICD-10-CM | POA: Diagnosis not present

## 2024-02-21 ENCOUNTER — Ambulatory Visit (INDEPENDENT_AMBULATORY_CARE_PROVIDER_SITE_OTHER): Admitting: Family Medicine

## 2024-02-21 ENCOUNTER — Encounter: Payer: Self-pay | Admitting: Family Medicine

## 2024-02-21 VITALS — BP 122/79 | HR 75 | Temp 98.6°F | Ht 69.0 in | Wt 241.0 lb

## 2024-02-21 DIAGNOSIS — Z6835 Body mass index (BMI) 35.0-35.9, adult: Secondary | ICD-10-CM | POA: Diagnosis not present

## 2024-02-21 DIAGNOSIS — M130 Polyarthritis, unspecified: Secondary | ICD-10-CM

## 2024-02-21 DIAGNOSIS — R4589 Other symptoms and signs involving emotional state: Secondary | ICD-10-CM

## 2024-02-21 DIAGNOSIS — E66812 Obesity, class 2: Secondary | ICD-10-CM | POA: Diagnosis not present

## 2024-02-21 DIAGNOSIS — K760 Fatty (change of) liver, not elsewhere classified: Secondary | ICD-10-CM | POA: Diagnosis not present

## 2024-02-21 NOTE — Progress Notes (Signed)
 "  Office: [phone]  /  Fax: [phone]  WEIGHT SUMMARY AND BIOMETRICS  Starting Date: 10/13/23  Starting Weight: 245lb   Weight Lost Since Last Visit: 0lb   Vitals Temp: 98.6 F (37 C) BP: 122/79 Pulse Rate: 75 SpO2: 98 %   Body Composition  Body Fat %: 27.5 % Fat Mass (lbs): 66.4 lbs Muscle Mass (lbs): 166.6 lbs Total Body Water  (lbs): 118.4 lbs Visceral Fat Rating : 14    HPI  Chief Complaint: OBESITY  Bradley Barr is here to discuss his progress with his obesity treatment plan. He is on the the Category 3 Plan and states he is following his eating plan approximately 20 % of the time. He states he is exercising 0 minutes 0 times per week.   Interval History:  Since last office visit he is up 1 lb He is up 2.2 lb of muscle mass and is down 1 lb of body fat since last visit This gives him a net weight loss of 4 lb in 4 mos of medically supervised weight management He has been struggling with depression and polyarthralgias He is motivated to work on improving his diet He has financial strain He does need to get back in with PT He has been frustrated that he cannot get back to work due to his joint pains  Pharmacotherapy: none  PHYSICAL EXAM:  Blood pressure 122/79, pulse 75, temperature 98.6 F (37 C), height 5' 9 (1.753 m), weight 241 lb (109.3 kg), SpO2 98%. Body mass index is 35.59 kg/m.  General: He is overweight, cooperative, alert, well developed, and in no acute distress. PSYCH: Has normal mood, affect and thought process.   Lungs: Normal breathing effort, no conversational dyspnea.   ASSESSMENT AND PLAN  TREATMENT PLAN FOR OBESITY:  Recommended Dietary Goals  Bradley Barr is currently in the action stage of change. As such, his goal is to continue weight management plan. He has agreed to the Category 3 Plan.  Behavioral Intervention  We discussed the following Behavioral Modification Strategies today: increasing lean protein intake to  established goals, increasing fiber rich foods, increasing water  intake , work on meal planning and preparation, keeping healthy foods at home, work on managing stress, creating time for self-care and relaxation, avoiding temptations and identifying enticing environmental cues, continue to practice mindfulness when eating, and planning for success.  Additional resources provided today: NA  Recommended Physical Activity Goals  Bradley Barr has been advised to work up to 150 minutes of moderate intensity aerobic activity a week and strengthening exercises 2-3 times per week for cardiovascular health, weight loss maintenance and preservation of muscle mass.   He has agreed to Think about enjoyable ways to increase daily physical activity and overcoming barriers to exercise and Increase physical activity in their day and reduce sedentary time (increase NEAT). Restart PT  Pharmacotherapy changes for the treatment of obesity: none  ASSOCIATED CONDITIONS ADDRESSED TODAY  Polyarthritis -     Ambulatory referral to Rheumatology Continues to have L shoulder pain following L shoulder hemiathroplasty, plans to resume PT.  Has bilateral elbow and knee pain that also limits his ability to work and exercise.  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  Refer to rheumatology for further eval and treatment.  Set up f/u with Dr Bradley Barr re: ongoing L shoulder pain  Obesity, Class II, BMI 35-39.9 High stress, financial strain and chronic pain, depression have been barriers to  his weight loss progress.  He is not a candidate for anti obesity medications.  BMI 35.0-35.9,adult  Fatty liver Continue to limit ETOH intake (has not started to reduce), and continue active weight loss plan.  Has upcoming visit with Dr Bradley Barr on 1/6 for further eval and treatment.  Depressed mood Worsened lately.  Not taking any medication for depression and  declined need for counseling.  Has support from girlfriend and checked into social services for further support.  Continue to work on self care, stress reduction and planning for future work opportunities.     He was informed of the importance of frequent follow up visits to maximize his success with intensive lifestyle modifications for his multiple health conditions.   ATTESTASTION STATEMENTS:  Reviewed by clinician on day of visit: allergies, medications, problem list, medical history, surgical history, family history, social history, and previous encounter notes pertinent to obesity diagnosis.   I personally spent a total of 30 minutes in the care of the patient today including preparing to see the patient, getting/reviewing separately obtained history, performing a medically appropriate exam/evaluation, counseling and educating, placing orders, and documenting clinical information in the EHR.    Bradley Barr, D.O. DABFM, DABOM [facility] [address] [place], [place] [address] [phone] "

## 2024-02-28 ENCOUNTER — Ambulatory Visit: Admitting: Gastroenterology

## 2024-02-28 VITALS — BP 133/80 | HR 77 | Temp 97.7°F | Ht 68.0 in | Wt 245.0 lb

## 2024-02-28 DIAGNOSIS — R7989 Other specified abnormal findings of blood chemistry: Secondary | ICD-10-CM | POA: Diagnosis not present

## 2024-02-28 NOTE — Patient Instructions (Addendum)
 The ultrasound has been scheduled for January 12th, you must arrive at 9:15AM to register. Location: [facility], [facility] entrance.  You can not have anything to eat or drink after 12 midnight the day before.  If you need to cancel or reschedule, please call [phone]

## 2024-02-28 NOTE — Progress Notes (Signed)
 "   Gastroenterology Consultation  Referring Provider:     Waylan Darice BRAVO, DO Primary Care Physician:  Wendolyn Jenkins Jansky, MD Primary Gastroenterologist:  Dr. Melany     Reason for Consultation:     Elevated liver function tests.    HPI:   Bradley Barr is a 44 y.o. y/o male referred for consultation & management of elevated liver function tests by Dr. Wendolyn, Jenkins Jansky, MD.    He began seeking care for weight loss with Dr. Marchia 1 year ago.  A few months into therapy, his AST was 31, ALT 42 with normal total bilirubin and alkaline phosphatase.  He has been losing weight, was initially 360 pounds and is currently 245.  His LFTs have improved coincident with the weight loss, with current AST 20 and ALT 31.  He does not have any recent abdominal imaging, with the last ultrasound in 2010 showing fatty liver and splenomegaly.  He has a history of alcohol use, and at least 1 admission for pancreatitis.  He continues to drink alcohol socially, having about 5 IPA beers per 14-day period. He currently smokes marijuana, averaging 2-3 joints per day.  There is a family history of liver cancer in an uncle.  Another uncle has liver cirrhosis. His mother might have rheumatoid arthritis, and he himself has multiple joint issues and is planned for rheumatology consultation.  There is no family history of colon polyps or colon cancer to his knowledge.   Past Medical History:  Diagnosis Date   Allergy    Anxiety    Depression    GERD (gastroesophageal reflux disease)    Joint pain    Kidney stones    Pancreatitis    Pancreatitis 2016   Pneumonia    Rheumatoid arthritis [facility])     Past Surgical History:  Procedure Laterality Date   ANKLE SURGERY  2021   achilles R-2, 1 on L   APPENDECTOMY     SHOULDER HEMI-ARTHROPLASTY Left 11/18/2023   Procedure: HEMIARTHROPLASTY, SHOULDER;  Surgeon: Sharl Selinda Dover, MD;  Location: [facility];  Service: Orthopedics;  Laterality: Left;    TONSILLECTOMY      Prior to Admission medications  Medication Sig Start Date End Date Taking? Authorizing Provider  ANASTROZOLE PO Take 0.25 mg by mouth daily.    [provider]  cetirizine  (ZYRTEC ) 10 MG tablet Take 1 tablet (10 mg total) by mouth daily. 01/06/24   Jodie Lavern CROME, MD  ibuprofen  (ADVIL ) 800 MG tablet Take 800 mg by mouth 3 (three) times daily as needed. 12/10/23   [provider]  ketoconazole  (NIZORAL ) 2 % cream Apply 1 Application topically daily. 12/27/23   Job Lukes, PA  methocarbamol (ROBAXIN) 750 MG tablet Take 750 mg by mouth 3 (three) times daily as needed. 12/10/23   [provider]  olopatadine  (PATANOL) 0.1 % ophthalmic solution Place 1 drop into both eyes 2 (two) times daily. 01/06/24   Jodie Lavern CROME, MD  ondansetron  (ZOFRAN -ODT) 4 MG disintegrating tablet Take 1 tablet (4 mg total) by mouth every 8 (eight) hours as needed for nausea or vomiting. 01/15/24   Fernand Rossie HERO, MD  oxyCODONE  (ROXICODONE ) 5 MG immediate release tablet Take 1 tablet (5 mg total) by mouth every 4 (four) hours as needed for moderate pain (pain score 4-6) or severe pain (pain score 7-10). 11/18/23 11/17/24  Sharl Selinda Dover, MD  PRESCRIPTION MEDICATION Take 25 mg by mouth every Monday, Wednesday, and Friday. Enclomiphene    [provider]  tadalafil  (CIALIS ) 20 MG tablet Take 0.5-1 tablets (10-20 mg total) by mouth every other day as needed for erectile dysfunction. 09/05/23   Wendolyn Jenkins Jansky, MD  TESTOSTERONE IM Inject 1 mL into the muscle every Sunday.    [provider]  triamcinolone  cream (KENALOG ) 0.1 % Apply 1 Application topically 2 (two) times daily. For 2 weeks, then as needed 01/06/24   Jodie Lavern CROME, MD    Family History  Problem Relation Age of Onset   Hypertension Mother    Arthritis Mother    Asthma Mother    Depression Mother    COPD Mother    Thyroid disease Father    Hypertension Father    Hyperthyroidism  Father    Arthritis Maternal Grandmother    Asthma Maternal Grandmother    COPD Maternal Grandmother    Heart disease Maternal Grandmother    Hearing loss Maternal Grandmother    Hyperlipidemia Maternal Grandmother    Stroke Maternal Grandmother    Cancer Maternal Grandmother    Hearing loss Maternal Grandfather    Heart disease Maternal Grandfather    Hyperlipidemia Maternal Grandfather    Diabetes Maternal Grandfather    ADD / ADHD Maternal Grandfather    Alcohol abuse Maternal Grandfather    Cancer Maternal Grandfather    Stroke Paternal Grandmother    Heart disease Paternal Grandfather 35 - 92   Early death Paternal Grandfather      Social History[1]  Allergies as of 02/28/2024   (No Known Allergies)    Review of Systems:    All systems reviewed and negative except where noted in HPI.   Physical Exam:  BP 133/80 (BP Location: Right Arm, Patient Position: Sitting, Cuff Size: Large)   Pulse 77   Temp 97.7 F (36.5 C) (Oral)   Ht 5' 8 (1.727 m)   Wt 245 lb (111.1 kg)   BMI 37.25 kg/m  No LMP for male patient. General:   Alert,  Well-developed, well-nourished, pleasant and cooperative in NAD Head:  Normocephalic and atraumatic. Eyes:  Sclera clear, no icterus.   Conjunctiva pink. Ears:  Normal auditory acuity. Neck:  Supple; no masses or thyromegaly. Lungs:  Respirations even and unlabored.  Clear throughout to auscultation.   No wheezes, crackles, or rhonchi. No acute distress. Heart:  Regular rate and rhythm; no murmurs, clicks, rubs, or gallops. Abdomen:  Normal bowel sounds.  No bruits.  Soft, non-tender and non-distended without masses, hepatosplenomegaly or hernias noted.  No guarding or rebound tenderness.  Negative Carnett sign.   Rectal:  Deferred.  Pulses:  Normal pulses noted. Extremities:  No clubbing or edema.  No cyanosis. Neurologic:  Alert and oriented x3;  grossly normal neurologically. Skin:  Intact without significant lesions or rashes.  No  jaundice. Lymph Nodes:  No significant cervical adenopathy. Psych:  Alert and cooperative. Normal mood and affect.  Imaging Studies: No results found.  Assessment and Plan:   Bradley Barr is a 44 y.o. y/o male with a history of fatty liver, last imaging in 2010, recent weight loss of 80 pounds, which represents 22% total body weight loss, with concomitant reduction in liver function tests. His fib 4 score, based on labs done 1 month ago, is 1.02, suggesting a very low risk for advanced fibrosis.  For this reason, in the setting of normal LFTs, there is no indication for liver elastography.  I recommended that he abstain from all alcohol use, and work to maintain his current  weight.  Studies have shown that marijuana use increases hepatic fibrosis risk.  He was advised of that today.  I asked him to avoid food and drink with high fructose corn syrup because this has been shown to increase hepatic steatosis.  In general, he should work to get a diversity of micronutrients by eating the rainbow.    Plan follow-up in 1 year.   Clotilda Schaffer, MD   Note: This dictation was prepared with Dragon dictation along with smaller phrase technology. Any transcriptional errors that result from this process are unintentional.       [1]  Social History Tobacco Use   Smoking status: Former    Current packs/day: 0.00    Average packs/day: 0.1 packs/day    Types: Cigarettes    Quit date: 03/03/2021    Years since quitting: 2.9   Smokeless tobacco: Never  Vaping Use   Vaping status: Former   Substances: Nicotine, Flavoring  Substance Use Topics   Alcohol use: Yes    Alcohol/week: 3.0 standard drinks of alcohol    Types: 3 Standard drinks or equivalent per week    Comment: occasionally-about q 2wk   Drug use: Yes    Frequency: 7.0 times per week    Types: Marijuana    Comment: last used 2 years ago   "

## 2024-03-05 ENCOUNTER — Ambulatory Visit

## 2024-03-27 ENCOUNTER — Ambulatory Visit: Admitting: Family Medicine

## 2024-04-10 ENCOUNTER — Ambulatory Visit: Admitting: Family Medicine

## 2024-06-04 ENCOUNTER — Ambulatory Visit: Admitting: Rheumatology

## 2024-11-05 ENCOUNTER — Ambulatory Visit
